# Patient Record
Sex: Female | Born: 1935 | Race: White | Hispanic: No | Smoking: Former smoker
Health system: Southern US, Community
[De-identification: ages and names within clinical notes are randomized; demographics above are authoritative.]

## PROBLEM LIST (undated history)

## (undated) DIAGNOSIS — I10 Essential (primary) hypertension: Secondary | ICD-10-CM

## (undated) DIAGNOSIS — F0390 Unspecified dementia without behavioral disturbance: Secondary | ICD-10-CM

## (undated) DIAGNOSIS — K589 Irritable bowel syndrome without diarrhea: Secondary | ICD-10-CM

## (undated) DIAGNOSIS — G629 Polyneuropathy, unspecified: Secondary | ICD-10-CM

## (undated) DIAGNOSIS — K317 Polyp of stomach and duodenum: Secondary | ICD-10-CM

## (undated) DIAGNOSIS — K5730 Diverticulosis of large intestine without perforation or abscess without bleeding: Secondary | ICD-10-CM

## (undated) HISTORY — DX: Irritable bowel syndrome, unspecified: K58.9

## (undated) HISTORY — DX: Polyp of stomach and duodenum: K31.7

## (undated) HISTORY — PX: CHOLECYSTECTOMY: SHX55

## (undated) HISTORY — DX: Polyneuropathy, unspecified: G62.9

## (undated) HISTORY — PX: ABDOMINAL HYSTERECTOMY: SHX81

## (undated) HISTORY — PX: APPENDECTOMY: SHX54

## (undated) HISTORY — DX: Unspecified dementia, unspecified severity, without behavioral disturbance, psychotic disturbance, mood disturbance, and anxiety: F03.90

## (undated) HISTORY — DX: Essential (primary) hypertension: I10

## (undated) HISTORY — PX: HEMORRHOID SURGERY: SHX153

## (undated) HISTORY — PX: ROTATOR CUFF REPAIR: SHX139

## (undated) HISTORY — PX: ANKLE SURGERY: SHX546

## (undated) HISTORY — DX: Diverticulosis of large intestine without perforation or abscess without bleeding: K57.30

---

## 2005-04-18 ENCOUNTER — Ambulatory Visit: Payer: Self-pay | Admitting: Cardiology

## 2005-06-06 ENCOUNTER — Encounter: Admission: RE | Admit: 2005-06-06 | Discharge: 2005-07-03 | Payer: Self-pay | Admitting: Orthopedic Surgery

## 2005-11-12 ENCOUNTER — Ambulatory Visit: Payer: Self-pay | Admitting: Cardiology

## 2005-11-13 ENCOUNTER — Ambulatory Visit: Payer: Self-pay | Admitting: Cardiology

## 2005-11-28 ENCOUNTER — Ambulatory Visit: Payer: Self-pay | Admitting: Cardiology

## 2006-02-27 ENCOUNTER — Ambulatory Visit: Payer: Self-pay | Admitting: Gastroenterology

## 2006-05-09 ENCOUNTER — Encounter (INDEPENDENT_AMBULATORY_CARE_PROVIDER_SITE_OTHER): Payer: Self-pay | Admitting: *Deleted

## 2006-05-09 ENCOUNTER — Ambulatory Visit: Payer: Self-pay | Admitting: Gastroenterology

## 2006-05-09 ENCOUNTER — Ambulatory Visit (HOSPITAL_COMMUNITY): Admission: RE | Admit: 2006-05-09 | Discharge: 2006-05-09 | Payer: Self-pay | Admitting: Gastroenterology

## 2006-06-05 ENCOUNTER — Ambulatory Visit: Payer: Self-pay | Admitting: Gastroenterology

## 2006-06-30 ENCOUNTER — Ambulatory Visit: Payer: Self-pay | Admitting: Cardiology

## 2006-07-21 ENCOUNTER — Ambulatory Visit: Payer: Self-pay | Admitting: Gastroenterology

## 2007-09-29 ENCOUNTER — Ambulatory Visit: Payer: Self-pay | Admitting: Cardiology

## 2008-01-21 ENCOUNTER — Encounter (HOSPITAL_COMMUNITY): Admission: RE | Admit: 2008-01-21 | Discharge: 2008-02-20 | Payer: Self-pay | Admitting: Otolaryngology

## 2008-02-29 ENCOUNTER — Encounter (HOSPITAL_COMMUNITY): Admission: RE | Admit: 2008-02-29 | Discharge: 2008-03-30 | Payer: Self-pay | Admitting: Otolaryngology

## 2008-11-28 ENCOUNTER — Ambulatory Visit: Payer: Self-pay | Admitting: Cardiology

## 2008-11-28 ENCOUNTER — Encounter: Payer: Self-pay | Admitting: Cardiology

## 2008-11-28 DIAGNOSIS — I10 Essential (primary) hypertension: Secondary | ICD-10-CM | POA: Insufficient documentation

## 2008-11-28 DIAGNOSIS — R079 Chest pain, unspecified: Secondary | ICD-10-CM

## 2009-06-13 DIAGNOSIS — K5900 Constipation, unspecified: Secondary | ICD-10-CM | POA: Insufficient documentation

## 2009-06-13 DIAGNOSIS — R197 Diarrhea, unspecified: Secondary | ICD-10-CM

## 2009-06-13 DIAGNOSIS — K589 Irritable bowel syndrome without diarrhea: Secondary | ICD-10-CM

## 2009-06-13 DIAGNOSIS — Z8719 Personal history of other diseases of the digestive system: Secondary | ICD-10-CM

## 2009-06-13 DIAGNOSIS — G589 Mononeuropathy, unspecified: Secondary | ICD-10-CM | POA: Insufficient documentation

## 2009-06-14 ENCOUNTER — Ambulatory Visit: Payer: Self-pay | Admitting: Gastroenterology

## 2009-06-14 DIAGNOSIS — R109 Unspecified abdominal pain: Secondary | ICD-10-CM

## 2009-06-14 DIAGNOSIS — D126 Benign neoplasm of colon, unspecified: Secondary | ICD-10-CM

## 2009-06-15 LAB — CONVERTED CEMR LAB
Bilirubin Urine: NEGATIVE
Casts: NONE SEEN /lpf
Hemoglobin, Urine: NEGATIVE
Ketones, ur: NEGATIVE mg/dL
Specific Gravity, Urine: 1.026 (ref 1.005–1.030)
Urine Glucose: NEGATIVE mg/dL
pH: 5.5 (ref 5.0–8.0)

## 2009-07-05 ENCOUNTER — Telehealth (INDEPENDENT_AMBULATORY_CARE_PROVIDER_SITE_OTHER): Payer: Self-pay

## 2009-07-06 ENCOUNTER — Ambulatory Visit: Payer: Self-pay | Admitting: Gastroenterology

## 2009-07-06 ENCOUNTER — Ambulatory Visit (HOSPITAL_COMMUNITY): Admission: RE | Admit: 2009-07-06 | Discharge: 2009-07-06 | Payer: Self-pay | Admitting: Gastroenterology

## 2009-07-07 ENCOUNTER — Encounter (INDEPENDENT_AMBULATORY_CARE_PROVIDER_SITE_OTHER): Payer: Self-pay

## 2009-08-17 ENCOUNTER — Ambulatory Visit: Payer: Self-pay | Admitting: Gastroenterology

## 2009-08-17 DIAGNOSIS — R152 Fecal urgency: Secondary | ICD-10-CM

## 2009-12-27 ENCOUNTER — Ambulatory Visit: Payer: Self-pay | Admitting: Gastroenterology

## 2010-01-17 ENCOUNTER — Ambulatory Visit: Payer: Self-pay | Admitting: Gastroenterology

## 2010-04-20 ENCOUNTER — Encounter (INDEPENDENT_AMBULATORY_CARE_PROVIDER_SITE_OTHER): Payer: Self-pay | Admitting: *Deleted

## 2010-05-15 NOTE — Assessment & Plan Note (Signed)
Summary: IRREGULAR BOWEL HABITS, PERSONAL HISTORY OF POLYPS   Visit Type:  Follow-up Visit Primary Care Provider:  Dimas Aguas, M.D.  Chief Complaint:  Irregularity.  History of Present Illness: Pt seen and evaluated in 2008 for intermittent constipation and diarrhea since childhood. Asked pt to use benefiber, Levsin, and TUMs. TCS with MLX prep showed many simpkle adenoma, rare TICS, and no hemorrhoids. She declined referral for ARM/BET Asked pt to try Kegel exercises. she tried high fiber diets but Sx ? worse. Tried low residue diet and Sx?Marland Kitchen Ot likely has pelvic floor dysfunction.Pt scheduled for follow up but did not showx2.  Still having problem with her bowels. Problem with bowels: every other week gets a spell of it. Husband passed away and doesn't eat regularly. Eats whole wheat bread and fruits. Bms: s/t once daily, every other day, and sometimes gets stubborn and won't move and then gets heaviness inlwr abd and then finally gets a complete emptying (hard to soft). Would like one BM every AM. Doesn't leave home wthout her clothes. Wosrt usu. happens midafternoon and its awful. Not necessarily after eating. Can have rectal urgency leading to accidents. No blood in stool or black tarry stools. Not using stool softener. Tried fiber capsule before. Tries not drink water because of bladder. Drank a beer last night and made her urinate a lot. Has urinary problems and can't control her bladder and pad causes bottom to be irritated.   Current Medications (verified): 1)  Acetaminophen 500 Mg  Caps (Acetaminophen) .... As Needed 2)  Aspirin Ec 325 Mg Tbec (Aspirin) .... As Needed 3)  Multivitamins  Tabs (Multiple Vitamin) .... As Directed 4)  Fish Oil .... Once Daily 5)  Ducosate Sodium 100mg  .... Two Times A Day As Needed 6)  Aricept .... One Tablet Daily  Allergies (verified): 1)  ! Codeine  Past History:  Past Medical History: HYPERTENSION, UNSPECIFIED (ICD-401.9) CHEST PAIN-UNSPECIFIED  (ICD-786.50) Peripheral neuropathy Dementia, ? Alzheimer's IBS: mixed Sigmoid colon diverticulosis Multiple simple adenomas TCS 2008  Past Surgical History: HEMORRHOIDECTOMY AT AGE 11 CHOLECYSTECTOMY APPENDECTOMY RIGHT ROTATOR CUFF TEAR SURGERY HYSTERECTOMY  Family History: Family History of Cancer Renal failure  Social History: Now widowed. Husband passed 2 years ago. Tobacco Use - Former. x 40 yrs Alcohol Use - yes Was a Runner, broadcasting/film/video and ran a dairy farm.  Review of Systems       APR 2008: 150 LBS  Per HPI otherwise all systems negative.  Vital Signs:  Patient profile:   77 year old female Height:      62 inches Weight:      146 pounds BMI:     26.80 Temp:     98.1 degrees F oral Pulse rate:   64 / minute BP sitting:   130 / 80  (left arm) Cuff size:   regular  Vitals Entered By: Cloria Spring LPN (June 14, 4096 11:17 AM)  Physical Exam  General:  Well developed, well nourished, no acute distress. Head:  Normocephalic and atraumatic. Eyes:  PERRLA, no icterus. Mouth:  No deformity or lesions. Neck:  Supple; no masses. Lungs:  Clear throughout to auscultation. Heart:  Regular rate and rhythm; no murmurs. Abdomen:   No masses, hepatosplenomegaly or hernias noted. Normal bowel sounds. Soft, mildly tender in BLQ and mildly distended.without guarding and without rebound.   Extremities:  No edema or deformities noted. Neurologic:  Alert and  oriented x4;  grossly normal neurologically.  Impression & Recommendations:  Problem # 1:  ABDOMINAL PAIN,  LOWER (ICD-789.09) Differential diagnosis includes: IBS-mixed, or cystitis. CHECK ua.  ADD BENEFIBER 2 tsp daily for 7 days then  use two times a day. Add dicylomine 10 mg ever morning. FOLLOW A HIGH FIBER DIET. See handout. OPV in 2 months.  Orders: T-Urinalysis ([id number]) T-Urine Microscopic ([id number]) Est. Patient Level V (96295)  Problem # 2:  FULL INCONTINENCE OF FECES (ICD-787.60)  Most likey due to age  andpelvic floor dysfunction. ADD BENEFIBER 2 tsp daily for 7 days then  use two times a day. Add dicylomine 10 mg ever morning. FOLLOW A HIGH FIBER DIET. See handout. ***KEGEL EXERCISES** Contract PELVIC FLOOR MUSCLES FOR 5 SECS AND RELEASE. REPEAT 10 TIMES. DO THIS THREE TIMES A DAY. BEGINNING MAR 10, ADD QUICK RELEASE EXERCISE. CONTRACT AND RELEASE PELVIC MUSCLES RAPIDLY 10 TIMES.  REPEAT 3 TIMES A DAY. Return visit in 2 months.  Orders: Est. Patient Level V (28413)  Problem # 3:  COLONIC POLYPS, ADENOMATOUS (ICD-211.3) Assessment: Unchanged  COLONOSCOPY WITHIN THE NEXT MONTH, Miralax prep.  CC: PCP  Orders: Est. Patient Level V (24401)  Patient Instructions: 1)  ADD BENEFIBER 2 tsp daily for 7 days then  use two times a day. 2)  Add dicylomine 10 mg ever morning. 3)  FOLLOW A HIGH FIBER DIET. See handout. 4)  COLONOSCOPY WITHIN THE NEXT MONTH. 5)  ***KEGEL EXERCISES** 6)  Contract PELVIC FLOOR MUSCLES FOR 5 SECS AND RELEASE. 7)  REPEAT 10 TIMES. DO THIS THREE TIMES A DAY. 8)  BEGINNING MAR 10, ADD QUICK RELEASE EXERCISE. 9)  CONTRACT AND RELEASE PELVIC MUSCLES RAPIDLY 10 TIMES.  10)  REPEAT 3 TIMES A DAY. 11)  Return visit in 2 months. 12)  The medication list was reviewed and reconciled.  All changed / newly prescribed medications were explained.  A complete medication list was provided to the patient / caregiver. Prescriptions: BENTYL 10 MG CAPS (DICYCLOMINE HCL) 1 by mouth every morning  #30 x 5   Entered and Authorized by:   West Bali MD   Signed by:   West Bali MD on 06/14/2009   Method used:   Print then Give to Patient   RxID:   [id number]

## 2010-05-15 NOTE — Progress Notes (Signed)
Summary: phone note  Phone Note Call from Patient   Caller: Patient Summary of Call: Pt called and said she is scheduled for TCS tomorrow. She is in process of drinking Prep. She was concerned because  she has already started having pressure and BM's and thought she was asked to drink too much. I told her no, that is great, seems like she is geting off to a good start. She said she just wanted to be reassured she did not have too much to drink. Initial call taken by: Cloria Spring LPN,  July 05, 2009 3:41 PM

## 2010-05-15 NOTE — Assessment & Plan Note (Signed)
Summary: DIARRHEA   Visit Type:  Follow-up Visit Primary Care Provider:  Dimas Hurst, M.D.   Chief Complaint:  F/U IBS.  History of Present Illness: Tried lactaid and it was nasty. Tried Found a new brand and it is lactose free. Bms: fair, frequnt but has few that are solid. Urgency: 2-3 times a month. Watches for the BR still. Taking the dicyclomine once a day. Didn't try twice a day. Nl formed stools-1x/week. Heavy fruit eater. If eats 3 "square" meals a day then has a regular BM. DOESN'T ALWAYS EAT MEAT. Has seen improvement with dairy free diet, BUT WOULD LIKE TO DRINK MILK. Only 1 extreme/eplosive attack.   Current Medications (verified): 1)  Acetaminophen 500 Mg  Caps (Acetaminophen) .... As Needed 2)  Aspirin Ec 325 Mg Tbec (Aspirin) .... As Needed 3)  Multivitamins  Tabs (Multiple Vitamin) .... As Directed 4)  Fish Oil .... Once Daily 5)  Aricept .... One Tablet Daily 6)  Bentyl 10 Mg Caps (Dicyclomine Hcl) .Marland Kitchen.. 1 By Mouth Every Morning and At [facility] Every Day 7)  Doxycycline Hyclate 100 Mg Caps (Doxycycline Hyclate) .... Take 1 Tablet By Mouth Once A Day 8)  Oxybutynin Chloride 10 Mg Xr24h-Tab (Oxybutynin Chloride) .... Take 1 Tablet By Mouth Once A Day  Allergies (verified): 1)  ! Codeine  Past History:  Past Medical History: Last updated: 08/17/2009 HYPERTENSION, UNSPECIFIED (ICD-401.9) CHEST PAIN-UNSPECIFIED (ICD-786.50) Peripheral neuropathy Dementia, ? Alzheimer's IBS: mixed Sigmoid colon diverticulosis Multiple simple adenomas TCS 2008 2010: TCS-simple adenomas and hyperplastic polyps  Past Surgical History: Last updated: 06/14/2009 HEMORRHOIDECTOMY AT AGE 79 CHOLECYSTECTOMY APPENDECTOMY RIGHT ROTATOR CUFF TEAR SURGERY HYSTERECTOMY  Vital Signs:  Patient profile:   77 year old female Height:      62 inches Weight:      145 pounds BMI:     26.62 Temp:     98.4 degrees F oral Pulse rate:   72 / minute BP sitting:   144 / 70  (left arm) Cuff size:    regular  Vitals Entered By: Cloria Spring LPN (December 27, 2009 9:31 AM)  Physical Exam  General:  Well developed, well nourished, no acute distress. Head:  Normocephalic and atraumatic. Eyes:  PERRL, no icterus. Lungs:  Clear throughout to auscultation. Heart:  Regular rate and rhythm; no murmurs. Abdomen:  Soft, nontender and nondistended. Normal bowel sounds.  Impression & Recommendations:  Problem # 1:  FECAL URGENCY (ICD-787.63) Likely 2o to loose stools. Loose stool multifactorial-bile salt, fruit consumption, and IBS, DOUBT THYROID DISTURBANCE. Take Calcium 500 mg with meals three times a day. Take Lactase pills if drinking 2% milk. Increase Bentyl to 1 tab 30 minutes prior to breakfast and lunch. Follow up in 4 mos. IF CONSTIPATED, REDUCE BENTYL TO ONCE A DAYand/or REDUCE CALCIUM TO two times a day.   CC: PCP  Orders: T-TSH ([id number])  Patient Instructions: 1)  Take Calcium 500 mg with meals three times a day. 2)  Take Lactase pills if drinking 2% milk. 3)  Increase Bentyl to 1 tab 30 minutes prior to breakfast and lunch. 4)  Follow up in 4 mos. 5)  IF YOU'RE CONSTIPATED, REDUCE BENTYL TO ONCE A DAY. 6)  IF YOU'RE CONSTIPATED, REDUCE CALCIUM TO two times a day. 7)  The medication list was reviewed and reconciled.  All changed / newly prescribed medications were explained.  A complete medication list was provided to the patient / caregiver. Prescriptions: BENTYL 10 MG CAPS (DICYCLOMINE HCL) 1 by mouth every morning  AND AT LUNCH EVERY DAY  #60 x 5   Entered and Authorized by:   West Bali MD   Signed by:   West Bali MD on 12/27/2009   Method used:   Electronically to        [facility] (retail)       [address]       [place], [place]  [address]       Ph: [phone]       Fax: [phone]   RxID:   [id number]   Appended Document: DIARRHEA 4 MONTH F/U OPV IS IN THE COMPUTER  Appended Document:  Orders Update    Clinical Lists Changes  Orders: Added new Service order of Est. Patient Level V (84132) - Signed

## 2010-05-15 NOTE — Assessment & Plan Note (Signed)
Summary: FECAL URGENCY   Visit Type:  Follow-up Visit Primary Care Provider:  Dimas Aguas, M.D.  Chief Complaint:  irregular bowels and cant control bowels.  History of Present Illness: Can't control bowels for all her life. BMs: vary-soft, loose, watery, formed. No pain. Drinks milk every AM w/ cereal. Occasional cheese and ice cream. Trouble with bowels: once every 2 weeks. Losing money because can't go on trip without a BR. Fish oil not causing upset stomach. Problems with bowels usu happens in the afternoon.   Current Medications (verified): 1)  Acetaminophen 500 Mg  Caps (Acetaminophen) .... As Needed 2)  Aspirin Ec 325 Mg Tbec (Aspirin) .... As Needed 3)  Multivitamins  Tabs (Multiple Vitamin) .... As Directed 4)  Fish Oil .... Once Daily 5)  Ducosate Sodium 100mg  .... Two Times A Day As Needed 6)  Aricept .... One Tablet Daily 7)  Bentyl 10 Mg Caps (Dicyclomine Hcl) .Marland Kitchen.. 1 By Mouth Every Morning  Allergies (verified): 1)  ! Codeine  Past History:  Past Surgical History: Last updated: 06/14/2009 HEMORRHOIDECTOMY AT AGE 11 CHOLECYSTECTOMY APPENDECTOMY RIGHT ROTATOR CUFF TEAR SURGERY HYSTERECTOMY  Past Medical History: HYPERTENSION, UNSPECIFIED (ICD-401.9) CHEST PAIN-UNSPECIFIED (ICD-786.50) Peripheral neuropathy Dementia, ? Alzheimer's IBS: mixed Sigmoid colon diverticulosis Multiple simple adenomas TCS 2008 2010: TCS-simple adenomas and hyperplastic polyps  Vital Signs:  Patient profile:   77 year old female Height:      62 inches Weight:      144 pounds BMI:     26.43 Temp:     98.1 degrees F oral Pulse rate:   68 / minute BP sitting:   132 / 80  (left arm) Cuff size:   large  Vitals Entered By: Hendricks Limes LPN (Aug 17, 1608 10:42 AM)  Physical Exam  General:  Well developed, well nourished, no acute distress. Head:  Normocephalic and atraumatic. Eyes:  PERRLA, no icterus. Lungs:  Clear throughout to auscultation. Heart:  Regular rate and rhythm; no  murmurs. Abdomen:  Soft, nontender and nondistended. Normal bowel sounds. Extremities:  No edema or deformities noted. Neurologic:  Alert and  oriented x4;  grossly normal neurologically.  Impression & Recommendations:  Problem # 1:  FECAL URGENCY (ICD-787.63) Pt unable to tolerate loose stool likely secondary to pelvic floor weakness and sensation associated with aging, and prior hemorrhoidectomy/hysterectomy. She may have lactose intolerance and bile-salt diarrhea. Pt instructed to bulk up stool, stop Colace, and increase Bentyl. INCREASE BENTYL-30 MINS BEFORE BREAKFAST AND LUNCH. TAKE THE BENTYL(DICYCLOMINE) regardless two times a dayAVOID DAIRY.ADD DIGESTIVE ADVANTAGE LACTOSE INTOLERANCE DAILY. She will call me in 2 weeks with a progress report. RETURN IN 3 MONTHS.  CC: PCP  Other Orders: Est. Patient Level IV (96045)  Patient Instructions: 1)  DO NOT TAKE DOCUSATE SODIUM. 2)  AVOID DAIRY. 3)  ADD DIGESTIVE ADVANTAGE LACTOSE INTOLERANCE DAILY. 4)  INCREASE BENTYL-30 MINS BEFORE BREAKFAST AND LUNCH. 5)  TAKE THE BENTYL(DICYCLOMINE) regardless two times a day 6)  Call me in 2 weeks with a progress report. 7)  RETURN IN 3 MONTHS. 8)  The medication list was reviewed and reconciled.  All changed / newly prescribed medications were explained.  A complete medication list was provided to the patient / caregiver. Prescriptions: BENTYL 10 MG CAPS (DICYCLOMINE HCL) 1 by mouth every morning AND AT LUNCH EVERY DAY  #60 x 5   Entered and Authorized by:   West Bali MD   Signed by:   West Bali MD on 08/17/2009  Method used:   Electronically to        [facility] (retail)       [address]       [place], [place]  [address]       Ph: [phone]       Fax: [phone]   RxID:   [id number]   Appended Document: FECAL URGENCY reminder in computer

## 2010-05-15 NOTE — Letter (Signed)
Summary: UROLOGY REFERRAL  UROLOGY REFERRAL   Imported By: Ave Filter 06/14/2009 13:17:21  _____________________________________________________________________  External Attachment:    Type:   Image     Comment:   External Document  Appended Document: UROLOGY REFERRAL Pt scheduled for 07/05/09@4 :15p.m.Marland KitchenMarland KitchenMarland KitchenPt aware of appt.

## 2010-05-15 NOTE — Letter (Signed)
Summary: Patient Notice, Colon Biopsy Results  [facility]  [address]   [place], [place] [address]   Phone: [phone]  Fax: [phone]       July 07, 2009   Shelly Hurst [address] [place], [place]  [address] December 28, 1924    Dear Shelly Hurst,  I am pleased to inform you that the biopsies taken during your recent colonoscopy did not show any evidence of cancer upon pathologic examination.  Additional information/recommendations:  __No further action is needed at this time.  Please follow-up with your primary care physician for your other healthcare needs.  __Please call [phone] to schedule a return visit to review your condition.  __Continue with the treatment plan as outlined on the day of your exam.  __You should have a repeat colonoscopy examination  in 5-10 years if benefits out weigh the risks. Please stay on a high fiber diet.  Please call us if you are having persistent problems or have questions about your condition that have not been fully answered at this time.  Sincerely,    Cloria Spring LPN  [facility] Ph: [phone]    Fax: [phone]

## 2010-05-15 NOTE — Letter (Signed)
Summary: TCS ORDER  TCS ORDER   Imported By: Ave Filter 06/14/2009 12:09:11  _____________________________________________________________________  External Attachment:    Type:   Image     Comment:   External Document

## 2010-05-17 NOTE — Letter (Signed)
Summary: Recall Office Visit  [facility]  [address]   [place], [place] [address]   Phone: [phone]  Fax: [phone]      April 20, 2010   Shelly Hurst [address] [place], [place]  [address] [birth year]   Dear Ms. [place]otin,   According to our records, it is time for you to schedule a follow-up office visit with [place].   At your convenience, please call [phone] to schedule an office visit. If you have any questions, concerns, or feel that this letter is in error, we would appreciate your call.   Sincerely,    Diana Eves  [facility] Ph: [phone]   Fax: [phone]

## 2010-08-28 NOTE — Assessment & Plan Note (Signed)
[facility]                          [facility] OFFICE NOTE   NAME:Fonseca, NAVDEEP FESSENDEN                       MRN:          [id number]  DATE:09/29/2007                            DOB:          10-10-1924    HISTORY OF PRESENT ILLNESS:  The patient is an 77 year old female with  no prior history of coronary artery disease, but hypertension.  Unfortunately, the patient's husband died several months ago and she is  still very tearful about this.  She denies any orthopnea, PND,  palpitation, or syncope.  However, from cardiovascular perspective she  appears to be doing well.   Currently, the patient takes no medications.   PHYSICAL EXAMINATION:  VITAL SIGNS:  Blood pressure 151/69, heart rate  66, and weight is 144 pounds.  NECK:  Normal carotid upstroke.  No carotid bruits.  LUNGS:  Clear breath sounds bilaterally.  HEART:  Regular rate and rhythm.  Normal S1 and S2.  No murmurs, rubs,  or gallops.  ABDOMEN:  Soft and nontender with good bowel sounds.  EXTREMITIES:  No cyanosis, clubbing, or edema.   A 12-lead electrocardiogram, normal sinus rhythm.  No acute ischemic  changes.   PROBLEMS:  1. History of atypical chest pain with low-risk adenosine Cardiolite      study in 2007.  2. Normal left ventricular function.  3. Hypertension.  4. Peripheral neuropathy.   PLAN:  1. The patient is doing well from a cardiac perspective.  She does      have however hypertension.  We prescribed her 12.5 mg of      hydrochlorothiazide.  2. EKG was reviewed today in the office and was within normal limits.      No further ischemic workup required at the present time.     Learta Codding, MD,FACC  Electronically Signed    GED/MedQ  DD: 09/29/2007  DT: 09/30/2007  Job #: [id number]

## 2010-08-31 NOTE — Assessment & Plan Note (Signed)
[facility]                          [facility] OFFICE NOTE   NAME:Shelly Hurst, Shelly Hurst                       MRN:          [id number]  DATE:06/30/2006                            DOB:          Jul 30, 1924    REASON FOR VISIT:  Scheduled followup. Please refer to Dr. Margarita Mail  clinic note of August 2007 for full details.   HISTORY OF PRESENT ILLNESS:  Since last seen, Shelly Hurst has not had any  recurrent angina pectoris. She was initially referred to [place] for chest  pain, which we felt to be atypical, but in the setting of multiple  cardiac risk factors. She had a low risk adenosine low level stress  Cardiolite in August 2007, reviewed by Dr. Andee Lineman, prior to her last  clinic followup.   On note, the patient has since taken herself off aspirin, as well as a  proton pump inhibitor, and has never been on any anti-hypertensive  medications. She states that she has never been formally diagnosed with  hypertension and is concerned that this may be related to her recent  over-the-counter remedy that she is taking for her peripheral  neuropathy.   CURRENT MEDICATIONS:  None.   PHYSICAL EXAMINATION:  VITAL SIGNS:  Blood pressure 160/74, pulse 66,  regular weight 149.  GENERAL:  A 77 year old female sitting upright. No distress.  HEENT:  Normocephalic and atraumatic.  NECK:  Palpable bilateral carotid pulses without bruits. No JVD.  LUNGS:  Clear to auscultation all fields.  HEART:  Regular rate and rhythm (S1 and S2). No murmur, rub, or gallop.  ABDOMEN:  Soft, nontender, and nondistended  EXTREMITIES:  No significant pedal edema.  NEUROLOGIC:  Flat affect but no focal deficits.   IMPRESSION:  1. Atypical chest pain.      a.     Low risk Adenosine stress Cardiolite August 2007.  2. Normal left ventricular function.  3. Hypertension.      a.     New onset.  4. Aortic sclerosis.  5. Peripheral neuropathy.   PLAN:  1. The patient strongly advised to  resume aspirin at the low dose of      81 mg daily, for primary prevention.  2. The patient strongly advised to start monitoring her blood pressure      on an ambulatory basis and to review this with Dr. Selinda Flavin, at      the time of her next visit. I will defer to Dr.      Dimas Aguas regarding whether or not the patient will need an      antihypertensive medication.  3. Schedule return clinic followup with myself and Dr. Andee Lineman in 1      year.      Gene Serpe, PA-C  Electronically Signed      Learta Codding, MD,FACC  Electronically Signed   GS/MedQ  DD: 06/30/2006  DT: 06/30/2006  Job #: [id number]   cc:   Selinda Flavin

## 2010-08-31 NOTE — Consult Note (Signed)
NAMEIDELLA, LAMONTAGNE                ACCOUNT NO.:  [id number]   MEDICAL RECORD NO.:  [id number]           PATIENT TYPE:   LOCATION:                                 FACILITY:   PHYSICIAN:  Kassie Mends, M.D.      DATE OF BIRTH:  [birth year]   DATE OF CONSULTATION:  02/27/2006  DATE OF DISCHARGE:                                   CONSULTATION   REASON FOR CONSULTATION:  Personal history of polyps and intermittent  constipation and diarrhea.   HISTORY OF PRESENT ILLNESS:  Ms. Melberg is a 77 year old female with a  lifetime history of irregular bowel habits.  She has a personal history of  polyps and her last colonoscopy was 5-6 years ago.  She states she has had  intermittent constipation and diarrhea since childhood.  She denies any  rectal bleeding currently but can have bleeding when she is constipated.  She sometimes has bowel movements that are absurd in size.  Sometimes, she  has to use Vaseline to get her bowel movements started.  At other times, she  has diarrhea.  If she uses fiber supplements, her bowel movements are too  much.  She does not have a bowel movement everyday.  She has 2-3 bowel  movements a week.  She may use stool softeners 1-2 times a week.  She is  managed using stool softeners all her life.  She has a problem with the  sudden onset of abdominal cramps followed by rectal urgency and a large  volume of watery stool.  She has had accidents.  She has 1-2 episodes per  year.  She has no particular food triggers.  She has noticed that this may  be more likely to be triggered by larger meals and eating out.   PAST MEDICAL HISTORY:  1. Irritable bowel syndrome.  2. Neuropathy of unclear etiology.  3. Gallstones.   PAST SURGICAL HISTORY:  1. Hemorrhoidectomy at age 89.  2. Cholecystectomy.  3. Appendectomy.  4. Right rotator cuff tear surgery.  5. Hysterectomy.   ALLERGIES:  CODEINE.   MEDICATIONS:  1. Tylenol as needed.  2. Ibuprofen as needed.  3.  Multi-vitamin.  4. Fish oil daily.   FAMILY HISTORY:  She denies any family history of colon cancer or colon  polyps.   SOCIAL HISTORY:  She has been married for 56 years.  Her daughter is a  Doctor, general practice in [place].  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  She  moved to [place] to be closer to her daughter so that she could  assist her with caring for her ailing husband.  She stopped smoking 17 years  ago.  She rarely drinks beer or wine.  [place]   REVIEW OF SYSTEMS:  As per the HPI, otherwise, all systems are negative.   PHYSICAL EXAMINATION:  VITAL SIGNS:  Weight 151.5 pounds, height 5 foot 2 inches, BMI 27.6  (slightly overweight), temperature 97.4, blood pressure 142/80, pulse 69.  GENERAL:  She is in no apparent distress, alert and oriented x4.  HEENT:  Normocephalic and atraumatic, pupils equal and reactive to light,  mouth with no oral lesions.  Posterior pharynx without erythema or exudate.  NECK:  Full range of motion and no lymphadenopathy.  LUNGS:  Clear to auscultation bilaterally.  CARDIOVASCULAR EXAM: Shows a regular rhythm, no murmur, normal S1 and S2.  ABDOMEN:  Bowel bowel sounds are present, soft, nontender, nondistended, no  rebound or guarding, no hepatosplenomegaly.  No abdominal bruits.  Her  midline incision is well healed.  Obese.  EXTREMITIES:  Without cyanosis, clubbing, and edema.  NEUROLOGICAL:  She has no focal neurological deficits.   LABORATORY DATA:  BUN 17, creatinine 0.7, potassium 4.3, albumin 4.2, AST  18, ALT 15, alkaline phos 69, total bilirubin 0.4, hemoglobin 14.4,  platelets 333.   ASSESSMENT:  Ms. Mcfall is a 77 year old female with intermittent  constipation and diarrhea.  Her postprandial abdominal cramps followed by  watery stool may be secondary to post cholecystectomy diarrhea.  She has a  personal history of polyps and her last colonoscopy was over five years ago.  Thank you for  allowing me to see Ms. Huneycutt in consultation.  My  recommendations follow.   RECOMMENDATIONS:  1. She will be scheduled for a colonoscopy in January 2008 with a Gatorade      bowel prep.  2. I recommend 1 tsp of Benefiber or Fibersure daily.  3. She may use 1-2 Tums with meals when eating out or with a larger meal      to avoid loose stools after eating.  She is cautioned that this may      cause constipation.  She may use 1-2 Levsin 0.125 mg tablets under her      tongue with the onset of abdominal pain to avoid loose stool.  She is      cautioned that this may cause dry mouth, dry eyes, blurred vision,      drowsiness, and urinary retention.  She is given this information in      written format.  4. She may follow up in three months.      Kassie Mends, M.D.  Electronically Signed     SM/MEDQ  D:  02/27/2006  T:  02/27/2006  Job:  [id number]   cc:   Selinda Flavin  Fax: [phone]

## 2010-08-31 NOTE — Assessment & Plan Note (Signed)
[facility]                            [facility] OFF[facility] NOTE   NAME:Shelly Hurst, Shelly Hurst                     MRN:          [id number]  DATE:11/12/2005                            DOB:          Jul 31, 1924    REASON FOR CONSULTATION:  Shelly Hurst is a very pleasant 77 year old female  with no prior history of heart disease, now referred for evaluation of  recent episode of angina pectoris.   The patient's  cardiac risk factors are notable for mixed dyslipidemia,  history of tobacco smoking, and age.  She has no known history of  hypertension, diabetes or family history of premature coronary artery  disease.   The patient presents with complaint of a recent episode of angina pectoris  which occurred November 04, 2005.  She characterized the discomfort as an  elephant sitting on my chest.  This occurred shortly after she lay down in  a recliner to take a nap, some 30 minutes after having had lunch.  The chest  pressure was associated with dyspnea and diaphoresis, but no nausea/vomiting  or radiation to the jaw or upper extremities.  The episode lasted  approximately 30 minutes and resolved spontaneously.  The patient did take  an aspirin. She has not had any recurrent symptoms since that episode. She  has, however, continued to perform her usual activities of daily living.   Notably, she also describes a similar episode, albeit less intense, which  occurred some two months ago.  She described that sensation as a slight  heaviness, again over the anterior chest and again associated shortly after  having had a meal.   The patient otherwise reports being quite active and, in fact, is able to  climb a full flight of stairs with no associated chest discomfort or  dyspnea.  She also denies any recent development of orthopnea, PND or lower  extremity edema.   Electrocardiogram in our office today reveals a couple of __________ at 64,  BMP with  left axis  deviation and nonspecific ST abnormalities; there are T  waves in leads I and aVL.   ALLERGIES:  CODEINE.  THE PATIENT DENIES ANY ALLERGY TO CONTRAST MATERIAL OR  SHELLFISH.   CURRENT MEDICATIONS:  Coated aspirin of 325 b.i.d., Protonix 20 daily  (however, patient has not taken either of these medications since last  Friday).   PAST MEDICAL HISTORY:  1.  Normal left ventricular function.  1a.  LVH/EF 60-65% with no definite WMAs; aortic sclerosis/no stenosis by  echocardiogram January 2007.  1.  Mixed dyslipidemia.  2a.  Recent lipid profile: Total cholesterol 176, triglycerides 232, HDL 33  and LDL 97.  1.  Status post cholecystectomy, 1964.  2.  Status post right shoulder surgery, 1987.  3.  Peripheral neuropathy, 05/08, nondiabetic.  4.  Status post hysterectomy, 1979.  5.  History of heart murmur.  6.  Status post left ankle surgery, December 2006.   SOCIAL HISTORY:  The patient is married and has two children.  She has not  smoked tobacco in 17 years but reports prior 40 pack year history  of  smoking.  Drinks alcohol on occasion.   FAMILY HISTORY:  Mother deceased at age 76 secondary to renal failure,  father deceased age 47 from complications of bladder cancer.  There is no  known family history of CAD.   REVIEW OF SYSTEMS:  In addition to as noted in the HPI, the patient has  occasional reflux symptoms which are not new.  She denies any evidence of  overt bleeding.  She complains of intermittent headaches and, in fact, had a  CT scan of the head earlier today (results are pending).  She denies any  prior history of myocardial infarction, congestive heart failure, syncope or  stroke.  Remaining systems negative.   PHYSICAL EXAMINATION:  VITAL SIGNS:  Blood pressure 130/70, pulse 64  regular, weight 148.  GENERAL: This is a 77 year old female, sitting upright, in no apparent  distress.  HEENT:  Normocephalic, atraumatic.  NECK:  Palpable bilateral carotid pulses  without bruits; no JVD.  LUNGS:  Clear to auscultation all fields.  HEART:  Regular rate and rhythm (S1 and S2).  No significant murmurs.  ABDOMEN:  Soft, nontender with intact  bowel sounds, no bruits.  EXTREMITIES:  Palpable bilateral femoral pulses with faint, soft left  femoral bruit, brisk peripheral pulses without edema.  NEUROLOGIC:  No focal  deficits.   IMPRESSION:  Shelly Hurst is a very pleasant 77 year old female with no prior  cardiac history, now referred for evaluation of recent episode of  postprandial angina pectoris associated with dyspnea and diaphoresis.  She  has not had any recurrent symptoms and denies any frank exertional chest  discharge since this episode.  The episode, however, is worrisome due to her  characterization of it as experiencing a sensation of an elephant sitting  on my chest.   The baseline electrocardiogram today shows no acute changes but is notable  for  changes of T waves in the high lateral leads.   The patient's cardiac risk factors are notable  for dyslipidemia,  history  of tobacco, and age.   Of note, an echocardiogram in January of this year revealed normal left  ventricular function with no definite  wall motion abnormalities; aortic  sclerosis without evidence of stenosis.   PLAN:  Following review with Dr. Simona Huh, recommendation is to proceed  with a stress Cardiolite which has been scheduled for tomorrow, per Dr.  Orvan Falconer.  The patient knows, however, that if perfusion imaging reveals any  suggestion of ischemia, then we will have the patient return to the office  to discuss proceeding with a diagnostic cardiac catheterization.  The  patient is quite agreeable with this plan and is willing to proceed with the  catheterization, if necessary.  We also discussed the risks/benefits of a  catheterization during today's visit.  Regarding the stress test, however, I also recommended my preference to change this to a physiologic  stress test  if at all possible.  She is willing to proceed with this as well.  Regarding  medications, I have asked her to go back on aspirin and that she just needs  to be on a baby dose for now. Further recommendations  regarding additional  medications will be made pending review of her stress test results.                                   Gene Serpe, PA-C  Jonelle Sidle, MD   GS/MedQ  DD:  11/12/2005  DT:  11/12/2005  Job #:  [id number]   cc:   Marcelino Duster

## 2010-08-31 NOTE — Assessment & Plan Note (Signed)
[facility]                            [facility] NOTE   NAME:Shelly Hurst, Shelly Hurst                     MRN:          [id number]  DATE:11/28/2005                            DOB:          [birth year]    HISTORY OF PRESENT ILLNESS:  The patient is a 77 year old female who was  recently seen in our office on November 12, 2005 after she experienced an  episode of substernal chest pain. The patient was referred for a Cardiolite  stress study which showed a small lateral defect with some reversibility. It  was felt that this was a low risk study. The patient presents for followup  and to discuss the results. She states that she has been doing extremely  well. She has no substernal chest pain, no shortness of breath. She did have  an ankle injury and somewhat limited in her activity. We discussed  extensively the results of study today.   MEDICATIONS:  1. Aspirin 81 mg a day.  2. Protonix 20 mg a day.   PHYSICAL EXAMINATION:  VITAL SIGNS:  Blood pressure 150/70, heart rate 60  beats per minute.  GENERAL:  A well-nourished white female in no apparent distress.  HEENT:  Pupils isocoric, conjunctivae clear.  Marland Kitchen  NECK:  Supple, no carotid bruits.  LUNGS:  Clear.  HEART:  Regular rate and rhythm with 2/6 crescendo/decrescendo murmur at the  right upper sternal border.  ABDOMEN:  Soft.  EXTREMITIES:  No edema.   PROBLEM LIST:  1. Substernal chest pain.      a.     No recurrent chest pain.      b.     Abnormal Cardiolite stress study, low risk.  2. Rule out hypertension.  3. Rule out peripheral neuropathy.  4. Cardiac murmur.   PLAN:  1. The patient's symptoms were somewhat atypical several weeks ago, they      have not recurred. Although we cannot rule out underlying coronary      artery disease based on the Cardiolite study, it appears to be low      risk.  2. Discussed with the patient various options either to treat her      conservatively  with risk factor modification versus __________ for      catheterization. She has opted for the former.  3. I have asked the patient to keep a close eye on her blood pressure as      she might need treatment for this.  4. The patient's heart murmur is benign and likely represents a flow      murmur.  5. The patient will be given a prescription for nitroglycerin p.r.n. in      the event she has future chest pain, and she will also notify our      office.                                   Learta Codding, MD, [facility]   GED/MedQ  DD:  11/28/2005  DT:  11/28/2005  Job #:  [id number]   cc:   Marcelino Duster

## 2010-08-31 NOTE — Op Note (Signed)
NAMEJOHNNA, Shelly Hurst                ACCOUNT NO.:  [id number]   MEDICAL RECORD NO.:  [id number]          PATIENT TYPE:  AMB   LOCATION:  DAY                           FACILITY:  [facility]   PHYSICIAN:  Kassie Mends, M.D.      DATE OF BIRTH:  05/10/1924   DATE OF PROCEDURE:  05/09/2006  DATE OF DISCHARGE:                               OPERATIVE REPORT   PROCEDURE:  Colonoscopy with cold forceps polypectomy, cold snare  polypectomy, and snare cautery polypectomy.   INDICATION FOR EXAM:  Shelly Hurst is a 78 year old female with a personal  history of polyps.  She presents for colon cancer screening.   FINDINGS:  1. Multiple 2 mm to 6 mm colon polyps seen throughout the colon.  She      had a single cecal polyp.  The polyps were seen in the ascending      colon, transverse colon, and a single rectal polyp was seen.  The      polyps were removed using various techniques, including cold      forceps biopsies, cold snare and snare cautery.  She had rare      sigmoid colon diverticulosis.  Otherwise no masses, inflammatory      changes or AVMs seen.  2. Normal retroflexed view of the rectum.   RECOMMENDATIONS:  1. High fiber diet.  She was given a handout on diverticulosis, polyps      and high fiber diet.  2. Screening colonoscopy in 3 years.  She has no biological children.  3. No aspirin or anti-inflammatory drugs or anticoagulation for 7      days.  I will call her with the results of her pathology report.  4. She has a follow-up appointment to see me on February12.   MEDICATIONS:  1. Demerol 50 mg IV.  2. Versed 6 mg IV.   PROCEDURE TECHNIQUE:  Physical exam was performed and informed consent  was obtained from the patient, after explaining the benefits, risks and  alternatives to the procedure.  The patient was connected to the monitor  and placed in the left lateral position.  Continuous oxygen was provided  by nasal cannula and IV medicine administered through an indwelling  cannula.  After administration of sedation and rectal exam, the  patient's rectum was intubated  and the scope was advanced under direct visualization to the cecum.  The  scope was subsequently removed slowly by carefully examining the color,  texture, anatomy and integrity of the mucosa on the way out.  The  patient was recovered in the endoscopy suite and discharged home in  satisfactory condition.      Kassie Mends, M.D.  Electronically Signed     SM/MEDQ  D:  05/09/2006  T:  05/09/2006  Job:  [id number]   cc:   Selinda Flavin  Fax: [phone]

## 2011-02-20 ENCOUNTER — Other Ambulatory Visit (HOSPITAL_COMMUNITY): Payer: Self-pay | Admitting: Radiology

## 2011-02-27 ENCOUNTER — Other Ambulatory Visit (HOSPITAL_COMMUNITY): Payer: Self-pay | Admitting: Radiology

## 2011-03-11 ENCOUNTER — Other Ambulatory Visit (HOSPITAL_COMMUNITY): Payer: Self-pay | Admitting: Diagnostic Neuroimaging

## 2011-03-11 DIAGNOSIS — R011 Cardiac murmur, unspecified: Secondary | ICD-10-CM

## 2011-03-12 ENCOUNTER — Ambulatory Visit (HOSPITAL_COMMUNITY): Payer: Medicare Other | Attending: Cardiology | Admitting: Radiology

## 2011-03-12 DIAGNOSIS — R011 Cardiac murmur, unspecified: Secondary | ICD-10-CM | POA: Insufficient documentation

## 2011-03-12 DIAGNOSIS — I319 Disease of pericardium, unspecified: Secondary | ICD-10-CM | POA: Insufficient documentation

## 2011-03-12 DIAGNOSIS — I10 Essential (primary) hypertension: Secondary | ICD-10-CM | POA: Insufficient documentation

## 2011-03-12 DIAGNOSIS — R42 Dizziness and giddiness: Secondary | ICD-10-CM | POA: Insufficient documentation

## 2011-03-13 ENCOUNTER — Encounter (HOSPITAL_COMMUNITY): Payer: Self-pay | Admitting: Diagnostic Neuroimaging

## 2011-03-15 ENCOUNTER — Other Ambulatory Visit: Payer: Self-pay | Admitting: Diagnostic Neuroimaging

## 2011-03-15 DIAGNOSIS — I6529 Occlusion and stenosis of unspecified carotid artery: Secondary | ICD-10-CM

## 2011-04-11 ENCOUNTER — Ambulatory Visit
Admission: RE | Admit: 2011-04-11 | Discharge: 2011-04-11 | Disposition: A | Discharge status: Home / Self Care | Payer: Medicare Other | Source: Ambulatory Visit | Attending: Diagnostic Neuroimaging | Admitting: Diagnostic Neuroimaging

## 2011-04-11 DIAGNOSIS — I6529 Occlusion and stenosis of unspecified carotid artery: Secondary | ICD-10-CM

## 2011-04-11 MED ORDER — IOHEXOL 350 MG/ML SOLN
100.0000 mL | Freq: Once | INTRAVENOUS | Status: AC | PRN
  Administered 2011-04-11: 100 mL via INTRAVENOUS

## 2011-10-16 ENCOUNTER — Encounter: Payer: Self-pay | Admitting: *Deleted

## 2011-10-16 ENCOUNTER — Encounter: Payer: Self-pay | Admitting: Cardiology

## 2012-08-24 ENCOUNTER — Ambulatory Visit (INDEPENDENT_AMBULATORY_CARE_PROVIDER_SITE_OTHER): Payer: Medicare Other | Admitting: Cardiology

## 2012-08-24 ENCOUNTER — Encounter: Payer: Self-pay | Admitting: Cardiology

## 2012-08-24 VITALS — BP 135/56 | HR 65 | Ht 62.0 in | Wt 142.0 lb

## 2012-08-24 DIAGNOSIS — I10 Essential (primary) hypertension: Secondary | ICD-10-CM

## 2012-08-24 NOTE — Patient Instructions (Addendum)
   No f/u needed. Your physician recommends that you continue on your current medications as directed. Please refer to the Current Medication list given to you today.

## 2012-08-24 NOTE — Progress Notes (Signed)
HPI The patient presents for evaluation of an abnormal echocardiogram. She has not had any prior cardiac history. However, recently she developed some lower extremity swelling. Her Norvasc was stopped area. She was treated with Lasix as below. Her edema has resolved. She did have an echocardiogram which demonstrates hypertrophic cardiomyopathy. There is an outflow gradient with a peak with Valsalva of 100 mmHg. The left atrium is moderately dilated. There is moderate mitral regurgitation. The aortic valve is calcified. Pulmonary pressures are mildly elevated. She was referred for evaluation of this.  The patient denies any cardiovascular symptoms. She denies any chest pressure, neck or arm discomfort. She does not have palpitations, presyncope or syncope. She denies any PND or orthopnea. In particular she denies any dyspnea. She can do activities such as walking her dog. She avoids adding extra salt.   Allergies  Allergen Reactions  . Codeine Other (See Comments)    hallucination    Current Outpatient Prescriptions  Medication Sig Dispense Refill  . aspirin EC 81 MG tablet Take 81 mg by mouth daily.      Marland Kitchen donepezil (ARICEPT) 10 MG tablet Take 10 mg by mouth at bedtime.      . furosemide (LASIX) 20 MG tablet Take 20 mg by mouth 2 (two) times daily.      Marland Kitchen lisinopril (PRINIVIL,ZESTRIL) 10 MG tablet Take 10 mg by mouth daily.      . memantine (NAMENDA) 10 MG tablet Take 10 mg by mouth 2 (two) times daily.      . Multiple Vitamins-Minerals (MULTIVITAMIN PO) Take 1 tablet by mouth daily.       . solifenacin (VESICARE) 5 MG tablet Take 10 mg by mouth daily.       No current facility-administered medications for this visit.    Past Medical History  Diagnosis Date  . Hypertension   . Chest pain   . Peripheral neuropathy   . Dementia   . IBS (irritable bowel syndrome)   . Diverticulosis of sigmoid colon   . Hyperplastic polyps of stomach     Past Surgical History  Procedure  Laterality Date  . Hemorrhoidecomy      at age 67  . Cholecystectomy    . Appendectomy    . Rotator cuff repair      Family History  Problem Relation Age of Onset  . Cancer    . Kidney failure      History   Social History  . Marital Status: Widowed    Spouse Name: N/A    Number of Children: N/A  . Years of Education: N/A   Occupational History  . Not on file.   Social History Main Topics  . Smoking status: Former Smoker -- 0.25 packs/day for 30 years    Start date: 04/15/1946    Quit date: 04/15/1988  . Smokeless tobacco: Never Used  . Alcohol Use: Yes  . Drug Use: Not on file  . Sexually Active: Not on file   Other Topics Concern  . Not on file   Social History Narrative  . No narrative on file    EAV:[id number] for dizziness, occasional difficulty swallowing and, urinary frequency, easy bruising. Negative for all other systems.  PHYSICAL EXAM BP 135/56  Pulse 65  Ht 5\' 2"  (1.575 m)  Wt 142 lb (64.411 kg)  BMI 25.97 kg/m2 GENERAL:  Well appearing HEENT:  Pupils equal round and reactive, fundi not visualized, oral mucosa unremarkable NECK:  No jugular venous distention, waveform within normal  limits, carotid upstroke brisk and symmetric, no bruits, no thyromegaly LYMPHATICS:  No cervical, inguinal adenopathy LUNGS:  Clear to auscultation bilaterally BACK:  No CVA tenderness CHEST:  Unremarkable HEART:  PMI not displaced or sustained,S1 and S2 within normal limits, no S3, no S4, no clicks, no rubs, no murmurs ABD:  Flat, positive bowel sounds normal in frequency in pitch, no bruits, no rebound, no guarding, no midline pulsatile mass, no hepatomegaly, no splenomegaly EXT:  2 plus pulses throughout, no edema, no cyanosis no clubbing SKIN:  No rashes no nodules NEURO:  Cranial nerves II through XII grossly intact, motor grossly intact throughout PSYCH:  Cognitively intact, oriented to person place and time  EKG:  Sinus rhythm, rate 68, left axis deviation,  left anterior fascicular block, no acute ST-T wave changes.  08/24/2012  ASSESSMENT AND PLAN  HCM:  The patient does have evidence of this on an echo. However, she has no symptoms related. Given this no specific therapy is indicated. We did discuss management and symptoms that can occur which can include heart failure symptoms. These would be treated if they occur. For now I don't think any change in therapy such as a negative inotrope is indicated.  EDEMA:  This has responded to change in blood pressure medicine and diuretic. There's not any evidence of pulmonary hypertension, right-sided failure or left side systolic failure. Whether this could of been related to Norvasc or perhaps a change in salt but she's living in a retirement community or some mild venous insufficiency I think is reasonable for her to reduce her current Lasix, watch her salt intake and let Dr. Dimas Aguas know if this worsens.  HTN:  The blood pressure is at target. No change in medications is indicated. We will continue with therapeutic lifestyle changes (TLC).

## 2012-08-27 ENCOUNTER — Encounter: Payer: Self-pay | Admitting: Cardiology

## 2014-04-13 ENCOUNTER — Emergency Department (HOSPITAL_COMMUNITY): Payer: Medicare Other

## 2014-04-13 ENCOUNTER — Encounter (HOSPITAL_COMMUNITY): Payer: Self-pay

## 2014-04-13 ENCOUNTER — Emergency Department (HOSPITAL_COMMUNITY)
Admission: EM | Admit: 2014-04-13 | Discharge: 2014-04-13 | Disposition: A | Discharge status: Home / Self Care | Payer: Medicare Other | Attending: Emergency Medicine | Admitting: Emergency Medicine

## 2014-04-13 DIAGNOSIS — F0390 Unspecified dementia without behavioral disturbance: Secondary | ICD-10-CM | POA: Insufficient documentation

## 2014-04-13 DIAGNOSIS — Y92129 Unspecified place in nursing home as the place of occurrence of the external cause: Secondary | ICD-10-CM | POA: Diagnosis not present

## 2014-04-13 DIAGNOSIS — Z23 Encounter for immunization: Secondary | ICD-10-CM | POA: Insufficient documentation

## 2014-04-13 DIAGNOSIS — Y9389 Activity, other specified: Secondary | ICD-10-CM | POA: Insufficient documentation

## 2014-04-13 DIAGNOSIS — Z87891 Personal history of nicotine dependence: Secondary | ICD-10-CM | POA: Insufficient documentation

## 2014-04-13 DIAGNOSIS — Z8719 Personal history of other diseases of the digestive system: Secondary | ICD-10-CM | POA: Insufficient documentation

## 2014-04-13 DIAGNOSIS — S61412A Laceration without foreign body of left hand, initial encounter: Secondary | ICD-10-CM | POA: Diagnosis not present

## 2014-04-13 DIAGNOSIS — S199XXA Unspecified injury of neck, initial encounter: Secondary | ICD-10-CM | POA: Diagnosis not present

## 2014-04-13 DIAGNOSIS — Z8669 Personal history of other diseases of the nervous system and sense organs: Secondary | ICD-10-CM | POA: Diagnosis not present

## 2014-04-13 DIAGNOSIS — Z79899 Other long term (current) drug therapy: Secondary | ICD-10-CM | POA: Diagnosis not present

## 2014-04-13 DIAGNOSIS — Y998 Other external cause status: Secondary | ICD-10-CM | POA: Diagnosis not present

## 2014-04-13 DIAGNOSIS — Z7982 Long term (current) use of aspirin: Secondary | ICD-10-CM | POA: Diagnosis not present

## 2014-04-13 DIAGNOSIS — W19XXXA Unspecified fall, initial encounter: Secondary | ICD-10-CM

## 2014-04-13 DIAGNOSIS — S0093XA Contusion of unspecified part of head, initial encounter: Secondary | ICD-10-CM | POA: Insufficient documentation

## 2014-04-13 DIAGNOSIS — I10 Essential (primary) hypertension: Secondary | ICD-10-CM | POA: Diagnosis not present

## 2014-04-13 DIAGNOSIS — S0990XA Unspecified injury of head, initial encounter: Secondary | ICD-10-CM | POA: Diagnosis present

## 2014-04-13 DIAGNOSIS — S3992XA Unspecified injury of lower back, initial encounter: Secondary | ICD-10-CM | POA: Diagnosis not present

## 2014-04-13 DIAGNOSIS — W01198A Fall on same level from slipping, tripping and stumbling with subsequent striking against other object, initial encounter: Secondary | ICD-10-CM | POA: Insufficient documentation

## 2014-04-13 MED ORDER — TETANUS-DIPHTH-ACELL PERTUSSIS 5-2.5-18.5 LF-MCG/0.5 IM SUSP
0.5000 mL | Freq: Once | INTRAMUSCULAR | Status: AC
  Administered 2014-04-13: 0.5 mL via INTRAMUSCULAR
  Filled 2014-04-13: qty 0.5

## 2014-04-13 NOTE — ED Notes (Signed)
Patient with "slip and fall" this morning at [facility]. Unwitnessed. Patient denies LOC. Hematoma to left upper forehead/temporal region. C/o headache mostly. Skin tear to left hand on fourth knuckle. Wound cleansed with sterile normal saline. Temporary dressing applied. Patient at baseline mental status. Denies n/v, denies vision changes.

## 2014-04-13 NOTE — ED Provider Notes (Signed)
CSN: [id number]     Arrival date & time 04/13/14  2353 History  This chart was scribed for Shelly Rank, MD by Lowella Petties, ED Scribe. The patient was seen in room APA04/APA04. Patient's care was started at 7:50 AM.    Chief Complaint  Patient presents with  . Fall   The history is provided by the patient. No language interpreter was used.   HPI Comments: Shelly Hurst is a 80 y.o. female who presents to the Emergency Department after a fall earlier today at her nursing home, [facility]. She states that she slipped and hit the left side of her head and hand on the floor. She denies LOC. She reports constant pain in her neck, left head, and left arm. She denies taking anticoagulants. She denies dizziness or weakness.   Past Medical History  Diagnosis Date  . Hypertension   . Peripheral neuropathy   . Dementia   . IBS (irritable bowel syndrome)   . Diverticulosis of sigmoid colon   . Hyperplastic polyps of stomach    Past Surgical History  Procedure Laterality Date  . Hemorrhoid surgery      at age 60  . Cholecystectomy    . Appendectomy    . Rotator cuff repair      right  . Abdominal hysterectomy    . Ankle surgery     Family History  Problem Relation Age of Onset  . Cancer    . Kidney failure     History  Substance Use Topics  . Smoking status: Former Smoker -- 0.25 packs/day for 30 years    Start date: 04/15/1946    Quit date: 04/15/1988  . Smokeless tobacco: Never Used  . Alcohol Use: Yes   OB History    No data available     Review of Systems  Musculoskeletal: Positive for back pain, arthralgias (left arm) and neck pain.  Neurological: Positive for headaches.  A complete 10 system review of systems was obtained and all systems are negative except as noted in the HPI and PMH.   Allergies  Codeine  Home Medications   Prior to Admission medications   Medication Sig Start Date End Date Taking? Authorizing Provider  aspirin EC 81 MG tablet Take 81 mg  by mouth daily.   Yes Historical Provider, MD  donepezil (ARICEPT) 10 MG tablet Take 10 mg by mouth at bedtime.   Yes Historical Provider, MD  furosemide (LASIX) 20 MG tablet Take 20 mg by mouth 2 (two) times daily.   Yes Historical Provider, MD  lisinopril (PRINIVIL,ZESTRIL) 10 MG tablet Take 10 mg by mouth daily.   Yes Historical Provider, MD  memantine (NAMENDA) 10 MG tablet Take 10 mg by mouth 2 (two) times daily.   Yes Historical Provider, MD  Multiple Vitamins-Minerals (MULTIVITAMIN PO) Take 1 tablet by mouth daily.    Yes Historical Provider, MD  solifenacin (VESICARE) 5 MG tablet Take 10 mg by mouth daily.   Yes Historical Provider, MD   Triage Vitals: BP 133/48 mmHg  Pulse 65  Temp(Src) 98.4 F (36.9 C) (Oral)  Resp 17  Ht 5\' 2"  (1.575 m)  Wt 145 lb (65.772 kg)  BMI 26.51 kg/m2  SpO2 95% Physical Exam  Constitutional: She appears well-developed and well-nourished. No distress.  HENT:  Head: Normocephalic.  Right Ear: External ear normal.  Left Ear: External ear normal.  Hematoma left forehead   Eyes: Conjunctivae are normal. Right eye exhibits no discharge. Left eye exhibits  no discharge. No scleral icterus.  Neck: Neck supple. No tracheal deviation present.  Cardiovascular: Normal rate, regular rhythm and intact distal pulses.   Pulmonary/Chest: Effort normal and breath sounds normal. No stridor. No respiratory distress. She has no wheezes. She has no rales.  Abdominal: Soft. Bowel sounds are normal. She exhibits no distension. There is no tenderness. There is no rebound and no guarding.  Musculoskeletal: She exhibits no edema.       Cervical back: She exhibits tenderness. She exhibits no bony tenderness, no swelling and no edema.       Thoracic back: She exhibits bony tenderness. She exhibits no swelling, no edema and no deformity.       Left hand: She exhibits no tenderness, no bony tenderness and normal two-point discrimination. Normal sensation noted. Normal strength  noted.  Laceration dorsal aspect of hand between thumb and first finger, no 1-2 mm deep, approx 1-2 cm length, no tendon or neurovascular injury  Neurological: She is alert. She has normal strength. No cranial nerve deficit (no facial droop, extraocular movements intact, no slurred speech) or sensory deficit. She exhibits normal muscle tone. She displays no seizure activity. Coordination normal.  Skin: Skin is warm and dry. No rash noted.  Psychiatric: She has a normal mood and affect.  Nursing note and vitals reviewed.   ED Course  LACERATION REPAIR Date/Time: 04/13/2014 9:45 AM Performed by: Shelly Hurst Authorized by: Shelly Hurst Consent: Verbal consent obtained. Risks and benefits: risks, benefits and alternatives were discussed Consent given by: patient Body area: upper extremity Location details: left hand Laceration length: 2 cm Tendon involvement: none Nerve involvement: none Vascular damage: no Irrigation solution: wound cleanser. Irrigation method: jet lavage Amount of cleaning: standard Debridement: none Degree of undermining: none Skin closure: glue Approximation: close Approximation difficulty: simple Patient tolerance: Patient tolerated the procedure well with no immediate complications   (including critical care time) DIAGNOSTIC STUDIES: Oxygen Saturation is 95% on room air, adequate by my interpretation.    COORDINATION OF CARE: 7:53 AM-Discussed treatment plan which includes back X-ray, head CT-scan, and laceration repair with pt at bedside and pt agreed to plan.   Labs Review Labs Reviewed - No data to display  Imaging Review Dg Thoracic Spine W/swimmers  04/13/2014   CLINICAL DATA:  Slipped and fell this morning. Back pain. Initial encounter.  EXAM: THORACIC SPINE - 2 VIEW + SWIMMERS  COMPARISON:  Chest x-ray 03/17/2014  FINDINGS: Degenerative changes throughout the thoracic spine with disc space narrowing and spurring. Normal alignment. No fracture.   IMPRESSION: No acute bony abnormality.   Electronically Signed   By: Rolm Baptise M.D.   On: 04/13/2014 08:33   Ct Head Wo Contrast  04/13/2014   CLINICAL DATA:  Patient fell at nursing, hitting left side of head. Pain  EXAM: CT HEAD WITHOUT CONTRAST  CT CERVICAL SPINE WITHOUT CONTRAST  TECHNIQUE: Multidetector CT imaging of the head and cervical spine was performed following the standard protocol without intravenous contrast. Multiplanar CT image reconstructions of the cervical spine were also generated.  COMPARISON:  Head CT March 18, 2014  FINDINGS: CT HEAD FINDINGS  There is mild diffuse atrophy, stable. There is no intracranial mass, hemorrhage, extra-axial fluid collection, or midline shift. There is slight small vessel disease in the centra semiovale bilaterally. Elsewhere gray-white compartments are within normal limits. There is no demonstrable acute infarct.  There is a sizable left frontal scalp hematoma, larger than on prior study. Bony calvarium appears intact. The mastoid  air cells are clear.  CT CERVICAL SPINE FINDINGS  There is no fracture or spondylolisthesis. Prevertebral soft tissues and predental space regions are normal. There is moderate disc space narrowing at C5-6 and C7-T1. There is partial ankylosis at C2-3. There is multilevel facet hypertrophy. No disc extrusion or stenosis. Exit foraminal narrowing is marked at C5-6 on the right due to bony hypertrophy. Note that there is a bone island in the left posterior C6 vertebral body. There is calcification in each carotid artery.  IMPRESSION: CT head: Sizable left frontal scalp hematoma. No fracture. Mild diffuse atrophy with mild periventricular small vessel disease. No intracranial mass, hemorrhage, or extra-axial fluid collection. No acute appearing infarct appreciable.  CT cervical spine: Multilevel arthropathy. No fracture or appreciable spondylolisthesis. Bilateral carotid artery calcification.   Electronically Signed   By: Lowella Grip M.D.   On: 04/13/2014 09:08   Ct Cervical Spine Wo Contrast  04/13/2014   CLINICAL DATA:  Patient fell at nursing, hitting left side of head. Pain  EXAM: CT HEAD WITHOUT CONTRAST  CT CERVICAL SPINE WITHOUT CONTRAST  TECHNIQUE: Multidetector CT imaging of the head and cervical spine was performed following the standard protocol without intravenous contrast. Multiplanar CT image reconstructions of the cervical spine were also generated.  COMPARISON:  Head CT March 18, 2014  FINDINGS: CT HEAD FINDINGS  There is mild diffuse atrophy, stable. There is no intracranial mass, hemorrhage, extra-axial fluid collection, or midline shift. There is slight small vessel disease in the centra semiovale bilaterally. Elsewhere gray-white compartments are within normal limits. There is no demonstrable acute infarct.  There is a sizable left frontal scalp hematoma, larger than on prior study. Bony calvarium appears intact. The mastoid air cells are clear.  CT CERVICAL SPINE FINDINGS  There is no fracture or spondylolisthesis. Prevertebral soft tissues and predental space regions are normal. There is moderate disc space narrowing at C5-6 and C7-T1. There is partial ankylosis at C2-3. There is multilevel facet hypertrophy. No disc extrusion or stenosis. Exit foraminal narrowing is marked at C5-6 on the right due to bony hypertrophy. Note that there is a bone island in the left posterior C6 vertebral body. There is calcification in each carotid artery.  IMPRESSION: CT head: Sizable left frontal scalp hematoma. No fracture. Mild diffuse atrophy with mild periventricular small vessel disease. No intracranial mass, hemorrhage, or extra-axial fluid collection. No acute appearing infarct appreciable.  CT cervical spine: Multilevel arthropathy. No fracture or appreciable spondylolisthesis. Bilateral carotid artery calcification.   Electronically Signed   By: Lowella Grip M.D.   On: 04/13/2014 09:08     EKG  Interpretation None      MDM   Final diagnoses:  Fall  Cephalohematoma  Hand laceration, left, initial encounter   At this time there does not appear to be any evidence of an acute emergency medical condition and the patient appears stable for discharge with appropriate outpatient follow up. Discussed findings with patient and duaghter  I personally performed the services described in this documentation, which was scribed in my presence.  The recorded information has been reviewed and is accurate.    Shelly Rank, MD 04/13/14 [phone]

## 2014-04-13 NOTE — ED Notes (Signed)
Pt here from [facility] for evaluation of fall. Pt states she slipped and fell. Complain of pain to left side of head and left hand. No loc

## 2014-04-13 NOTE — ED Notes (Signed)
Olivia Mackie at [facility] given report and informed patient was coming back to NH via Daughter. Patient with no complaints at this time. Respirations even and unlabored. Skin warm/dry. Discharge instructions reviewed with patient's daughter at this time. Patient's daughter given opportunity to voice concerns/ask questions. Patient discharged at this time and left Emergency Department via wheelchair.

## 2014-04-13 NOTE — Discharge Instructions (Signed)
Contusion A contusion is a deep bruise. Contusions are the result of an injury that caused bleeding under the skin. The contusion may turn blue, purple, or yellow. Minor injuries will give you a painless contusion, but more severe contusions may stay painful and swollen for a few weeks.  CAUSES  A contusion is usually caused by a blow, trauma, or direct force to an area of the body. SYMPTOMS   Swelling and redness of the injured area.  Bruising of the injured area.  Tenderness and soreness of the injured area.  Pain. DIAGNOSIS  The diagnosis can be made by taking a history and physical exam. An X-ray, CT scan, or MRI may be needed to determine if there were any associated injuries, such as fractures. TREATMENT  Specific treatment will depend on what area of the body was injured. In general, the best treatment for a contusion is resting, icing, elevating, and applying cold compresses to the injured area. Over-the-counter medicines may also be recommended for pain control. Ask your caregiver what the best treatment is for your contusion. HOME CARE INSTRUCTIONS   Put ice on the injured area.  Put ice in a plastic bag.  Place a towel between your skin and the bag.  Leave the ice on for 15-20 minutes, 3-4 times a day, or as directed by your health care provider.  Only take over-the-counter or prescription medicines for pain, discomfort, or fever as directed by your caregiver. Your caregiver may recommend avoiding anti-inflammatory medicines (aspirin, ibuprofen, and naproxen) for 48 hours because these medicines may increase bruising.  Rest the injured area.  If possible, elevate the injured area to reduce swelling. SEEK IMMEDIATE MEDICAL CARE IF:   You have increased bruising or swelling.  You have pain that is getting worse.  Your swelling or pain is not relieved with medicines. MAKE SURE YOU:   Understand these instructions.  Will watch your condition.  Will get help right  away if you are not doing well or get worse. Document Released: 01/09/2005 Document Revised: 04/06/2013 Document Reviewed: 02/04/2011 [facility] Patient Information 2015 [place], [place]. This information is not intended to replace advice given to you by your health care provider. Make sure you discuss any questions you have with your health care provider. Tissue Adhesive Wound Care Some cuts, wounds, lacerations, and incisions can be repaired by using tissue adhesive. Tissue adhesive is like glue. It holds the skin together, allowing for faster healing. It forms a strong bond on the skin in about 1 minute and reaches its full strength in about 2 or 3 minutes. The adhesive disappears naturally while the wound is healing. It is important to take proper care of your wound at home while it heals.  HOME CARE INSTRUCTIONS   Showers are allowed. Do not soak the area containing the tissue adhesive. Do not take baths, swim, or use hot tubs. Do not use any soaps or ointments on the wound. Certain ointments can weaken the glue.  If a bandage (dressing) has been applied, follow your health care provider's instructions for how often to change the dressing.   Keep the dressing dry if one has been applied.   Do not scratch, pick, or rub the adhesive.   Do not place tape over the adhesive. The adhesive could come off when pulling the tape off.   Protect the wound from further injury until it is healed.   Protect the wound from sun and tanning bed exposure while it is healing and for several  weeks after healing.   Only take over-the-counter or prescription medicines as directed by your health care provider.   Keep all follow-up appointments as directed by your health care provider. SEEK IMMEDIATE MEDICAL CARE IF:   Your wound becomes red, swollen, hot, or tender.   You develop a rash after the glue is applied.  You have increasing pain in the wound.   You have a red streak that goes away from  the wound.   You have pus coming from the wound.   You have increased bleeding.  You have a fever.  You have shaking chills.   You notice a bad smell coming from the wound.   Your wound or adhesive breaks open.  MAKE SURE YOU:   Understand these instructions.  Will watch your condition.  Will get help right away if you are not doing well or get worse. Document Released: 09/25/2000 Document Revised: 01/20/2013 Document Reviewed: 10/21/2012 [facility] Patient Information 2015 [place], [place]. This information is not intended to replace advice given to you by your health care provider. Make sure you discuss any questions you have with your health care provider.

## 2014-04-15 ENCOUNTER — Emergency Department (HOSPITAL_COMMUNITY)
Admission: EM | Admit: 2014-04-15 | Discharge: 2014-04-16 | Disposition: A | Discharge status: Home / Self Care | Payer: PPO | Attending: Emergency Medicine | Admitting: Emergency Medicine

## 2014-04-15 ENCOUNTER — Encounter (HOSPITAL_COMMUNITY): Payer: Self-pay | Admitting: Emergency Medicine

## 2014-04-15 ENCOUNTER — Emergency Department (HOSPITAL_COMMUNITY): Payer: PPO

## 2014-04-15 DIAGNOSIS — Z8669 Personal history of other diseases of the nervous system and sense organs: Secondary | ICD-10-CM | POA: Insufficient documentation

## 2014-04-15 DIAGNOSIS — R001 Bradycardia, unspecified: Secondary | ICD-10-CM | POA: Diagnosis not present

## 2014-04-15 DIAGNOSIS — S0003XA Contusion of scalp, initial encounter: Secondary | ICD-10-CM | POA: Diagnosis not present

## 2014-04-15 DIAGNOSIS — Y9389 Activity, other specified: Secondary | ICD-10-CM | POA: Diagnosis not present

## 2014-04-15 DIAGNOSIS — Y998 Other external cause status: Secondary | ICD-10-CM | POA: Insufficient documentation

## 2014-04-15 DIAGNOSIS — Z8719 Personal history of other diseases of the digestive system: Secondary | ICD-10-CM | POA: Diagnosis not present

## 2014-04-15 DIAGNOSIS — S0990XA Unspecified injury of head, initial encounter: Secondary | ICD-10-CM | POA: Insufficient documentation

## 2014-04-15 DIAGNOSIS — F0390 Unspecified dementia without behavioral disturbance: Secondary | ICD-10-CM | POA: Insufficient documentation

## 2014-04-15 DIAGNOSIS — W1839XA Other fall on same level, initial encounter: Secondary | ICD-10-CM | POA: Insufficient documentation

## 2014-04-15 DIAGNOSIS — Z7982 Long term (current) use of aspirin: Secondary | ICD-10-CM | POA: Diagnosis not present

## 2014-04-15 DIAGNOSIS — T148 Other injury of unspecified body region: Secondary | ICD-10-CM | POA: Diagnosis not present

## 2014-04-15 DIAGNOSIS — Y92128 Other place in nursing home as the place of occurrence of the external cause: Secondary | ICD-10-CM | POA: Diagnosis not present

## 2014-04-15 DIAGNOSIS — Z79899 Other long term (current) drug therapy: Secondary | ICD-10-CM | POA: Diagnosis not present

## 2014-04-15 DIAGNOSIS — I10 Essential (primary) hypertension: Secondary | ICD-10-CM | POA: Diagnosis not present

## 2014-04-15 DIAGNOSIS — Z87891 Personal history of nicotine dependence: Secondary | ICD-10-CM | POA: Insufficient documentation

## 2014-04-15 DIAGNOSIS — Z791 Long term (current) use of non-steroidal anti-inflammatories (NSAID): Secondary | ICD-10-CM | POA: Diagnosis not present

## 2014-04-15 DIAGNOSIS — T148XXA Other injury of unspecified body region, initial encounter: Secondary | ICD-10-CM

## 2014-04-15 LAB — CBG MONITORING, ED: Glucose-Capillary: 83 mg/dL (ref 70–99)

## 2014-04-15 MED ORDER — ACETAMINOPHEN 325 MG PO TABS
650.0000 mg | ORAL_TABLET | Freq: Once | ORAL | Status: AC
  Administered 2014-04-15: 650 mg via ORAL
  Filled 2014-04-15: qty 2

## 2014-04-15 NOTE — Discharge Instructions (Signed)
Please continue to have NH remind patient to use walker.   If you were given medicines take as directed.  If you are on coumadin or contraceptives realize their levels and effectiveness is altered by many different medicines.  If you have any reaction (rash, tongues swelling, other) to the medicines stop taking and see a physician.   Please follow up as directed and return to the ER or see a physician for new or worsening symptoms.  Thank you. Filed Vitals:   04/15/14 2121  BP: 135/42  Pulse: 50  Temp: 97.6 F (36.4 C)  TempSrc: Oral  Resp: 18  Height: 5' 0.5" (1.537 m)  Weight: 140 lb (63.504 kg)  SpO2: 94%

## 2014-04-15 NOTE — ED Notes (Signed)
Pt had unwitnessed fall at nursing facility, [facility].

## 2014-04-15 NOTE — ED Provider Notes (Signed)
CSN: [id number]     Arrival date & time 04/15/14  2116 History   First MD Initiated Contact with Patient 04/15/14 2120     Chief Complaint  Patient presents with  . Fall     (Consider location/radiation/quality/duration/timing/severity/associated sxs/prior Treatment) HPI Comments: 81 year old female with history of dementia, high blood pressure, frequent falls presents with acute head injury after unwitnessed fall at the nursing facility. Patient recently had similar episode. Patient has bruising/superficial wounds multiple to arm head face for which daughter says oral old except the left temple region appears more significant. Patient also complained about neck pain. No other obvious injuries, patient's post use a walker however rarely does.  Patient is a 81 y.o. female presenting with fall. The history is provided by the patient, a relative and the nursing home.  Fall    Past Medical History  Diagnosis Date  . Hypertension   . Peripheral neuropathy   . Dementia   . IBS (irritable bowel syndrome)   . Diverticulosis of sigmoid colon   . Hyperplastic polyps of stomach    Past Surgical History  Procedure Laterality Date  . Hemorrhoid surgery      at age 6  . Cholecystectomy    . Appendectomy    . Rotator cuff repair      right  . Abdominal hysterectomy    . Ankle surgery     Family History  Problem Relation Age of Onset  . Cancer    . Kidney failure     History  Substance Use Topics  . Smoking status: Former Smoker -- 0.25 packs/day for 30 years    Start date: 04/15/1946    Quit date: 04/15/1988  . Smokeless tobacco: Never Used  . Alcohol Use: Yes   OB History    No data available     Review of Systems  Unable to perform ROS: Dementia      Allergies  Codeine  Home Medications   Prior to Admission medications   Medication Sig Start Date End Date Taking? Authorizing Provider  acetaminophen (TYLENOL) 325 MG tablet Take 650 mg by mouth 2 (two) times daily.    Yes Historical Provider, MD  acetaminophen (TYLENOL) 650 MG CR tablet Take 650 mg by mouth every 6 (six) hours as needed for pain.   Yes Historical Provider, MD  aspirin EC 81 MG tablet Take 81 mg by mouth daily.   Yes Historical Provider, MD  calcium-vitamin D (OSCAL WITH D) 500-200 MG-UNIT per tablet Take 1 tablet by mouth 2 (two) times daily.   Yes Historical Provider, MD  donepezil (ARICEPT) 5 MG tablet Take 5 mg by mouth daily.   Yes Historical Provider, MD  lisinopril (PRINIVIL,ZESTRIL) 20 MG tablet Take 20 mg by mouth daily.   Yes Historical Provider, MD  LORazepam (ATIVAN) 1 MG tablet Take 1 mg by mouth every 6 (six) hours as needed for anxiety.   Yes Historical Provider, MD  magnesium hydroxide (MILK OF MAGNESIA) 400 MG/5ML suspension Take 30 mLs by mouth every 12 (twelve) hours as needed for mild constipation.   Yes Historical Provider, MD  Memantine HCl ER (NAMENDA XR) 28 MG CP24 Take 28 mg by mouth daily.   Yes Historical Provider, MD  Multiple Vitamin (MULTIVITAMIN WITH MINERALS) TABS tablet Take 1 tablet by mouth daily.   Yes Historical Provider, MD  ondansetron (ZOFRAN) 4 MG tablet Take 4 mg by mouth every 8 (eight) hours as needed for nausea or vomiting.   Yes Historical Provider,  MD  zolpidem (AMBIEN) 5 MG tablet Take 5 mg by mouth at bedtime.   Yes Historical Provider, MD   BP 135/42 mmHg  Pulse 50  Temp(Src) 97.6 F (36.4 C) (Oral)  Resp 18  Ht 5' 0.5" (1.537 m)  Wt 140 lb (63.504 kg)  BMI 26.88 kg/m2  SpO2 94% Physical Exam  Constitutional: She appears well-developed. No distress.  HENT:  Multiple areas of ecchymosis and superficial abrasion bilateral maxillary and most significant left temple region with large ecchymotic area/swollen and tender palpation. No obvious step-off appreciated. Full range of motion head neck with mild discomfort lower cervical paraspinal. No significant midline tenderness cervical, thoracic or lumbar spine.  Eyes: Pupils are equal, round,  and reactive to light.  Neck: Normal range of motion. Neck supple.  Cardiovascular: Bradycardia present.   Pulmonary/Chest: Effort normal. No respiratory distress.  Abdominal: Soft. There is no tenderness.  Musculoskeletal: She exhibits edema and tenderness.  Full range of motion bilateral hips and knees without discomfort/pain. Patient is mild discomfort left hand and wrists this is from a previous injury per daughter, superficial abrasion/laceration was previously repaired on the dorsum of the hand. No sign of infection.  Neurological: She is alert.  Patient pleasant dementia, pupils equal, horizontal eye movements intact, neck supple. Patient moves all extremities equal strength bilateral. Gross sensation intact bilateral.  Skin: Skin is warm.  Psychiatric:  Pleasant dementia  Nursing note and vitals reviewed.   ED Course  Procedures (including critical care time) Labs Review Labs Reviewed  CBG MONITORING, ED    Imaging Review Ct Head Wo Contrast  04/15/2014   CLINICAL DATA:  Multiple falls recently. Hematoma to the left side of the head. Old bruises on the left face. Dizziness, weakness, altered mental status. Left-sided neck pain. Headache. Posterior neck pain. Acute head injury.  EXAM: CT HEAD WITHOUT CONTRAST  CT CERVICAL SPINE WITHOUT CONTRAST  TECHNIQUE: Multidetector CT imaging of the head and cervical spine was performed following the standard protocol without intravenous contrast. Multiplanar CT image reconstructions of the cervical spine were also generated.  COMPARISON:  CT head and cervical spine 04/13/2014.  FINDINGS: CT HEAD FINDINGS  Subcutaneous scalp hematoma again demonstrated over the left frontal region similar prior study. Mild diffuse cerebral atrophy. Mild ventricular dilatation consistent with central atrophy. Patchy low-attenuation change in the deep white matter consistent with small vessel ischemia. No mass effect or midline shift. No abnormal extra-axial fluid  collections. Gray-white matter junctions are distinct. Basal cisterns are not effaced. No evidence of acute intracranial hemorrhage. No depressed skull fractures. Visualized paranasal sinuses and mastoid air cells are not opacified. Vascular calcifications.  CT CERVICAL SPINE FINDINGS  Straightening of cervical lordosis which may be due to patient positioning but ligamentous injury or muscle spasm could also have this appearance and are not excluded. Diffuse degenerative change throughout the cervical spine with narrowed cervical interspaces and associated endplate hypertrophic changes. Degenerative changes throughout the cervical facet joints. C1-2 articulation appears intact. No vertebral compression deformities. No prevertebral soft tissue swelling. No focal bone lesion or bone destruction. Bone cortex and trabecular architecture appear intact. Bilateral apical pleural scarring is demonstrated. Vascular calcifications in the cervical carotid arteries. Left thyroid nodule measures 1.9 x 1.4 cm. Consider further evaluation with ultrasound and elective setting.  IMPRESSION: No acute intracranial abnormalities. Chronic atrophy and small vessel ischemic changes. Left subcutaneous scalp hematoma again demonstrated without change.  Nonspecific straightening of the usual cervical lordosis, unchanged since prior study. Diffuse degenerative change in  the cervical spine. No displaced fractures identified.   Electronically Signed   By: Lucienne Capers M.D.   On: 04/15/2014 22:40   Ct Cervical Spine Wo Contrast  04/15/2014   CLINICAL DATA:  Multiple falls recently. Hematoma to the left side of the head. Old bruises on the left face. Dizziness, weakness, altered mental status. Left-sided neck pain. Headache. Posterior neck pain. Acute head injury.  EXAM: CT HEAD WITHOUT CONTRAST  CT CERVICAL SPINE WITHOUT CONTRAST  TECHNIQUE: Multidetector CT imaging of the head and cervical spine was performed following the standard  protocol without intravenous contrast. Multiplanar CT image reconstructions of the cervical spine were also generated.  COMPARISON:  CT head and cervical spine 04/13/2014.  FINDINGS: CT HEAD FINDINGS  Subcutaneous scalp hematoma again demonstrated over the left frontal region similar prior study. Mild diffuse cerebral atrophy. Mild ventricular dilatation consistent with central atrophy. Patchy low-attenuation change in the deep white matter consistent with small vessel ischemia. No mass effect or midline shift. No abnormal extra-axial fluid collections. Gray-white matter junctions are distinct. Basal cisterns are not effaced. No evidence of acute intracranial hemorrhage. No depressed skull fractures. Visualized paranasal sinuses and mastoid air cells are not opacified. Vascular calcifications.  CT CERVICAL SPINE FINDINGS  Straightening of cervical lordosis which may be due to patient positioning but ligamentous injury or muscle spasm could also have this appearance and are not excluded. Diffuse degenerative change throughout the cervical spine with narrowed cervical interspaces and associated endplate hypertrophic changes. Degenerative changes throughout the cervical facet joints. C1-2 articulation appears intact. No vertebral compression deformities. No prevertebral soft tissue swelling. No focal bone lesion or bone destruction. Bone cortex and trabecular architecture appear intact. Bilateral apical pleural scarring is demonstrated. Vascular calcifications in the cervical carotid arteries. Left thyroid nodule measures 1.9 x 1.4 cm. Consider further evaluation with ultrasound and elective setting.  IMPRESSION: No acute intracranial abnormalities. Chronic atrophy and small vessel ischemic changes. Left subcutaneous scalp hematoma again demonstrated without change.  Nonspecific straightening of the usual cervical lordosis, unchanged since prior study. Diffuse degenerative change in the cervical spine. No displaced  fractures identified.   Electronically Signed   By: Lucienne Capers M.D.   On: 04/15/2014 22:40     EKG Interpretation None      MDM   Final diagnoses:  Acute head injury  Scalp hematoma, initial encounter  Skin abrasion   Patient with dementia history and frequent falls presents after unwitnessed fall and evidence of head and neck injury. With age in unwitnessed fall and dementia history plan for CT head and neck, Tylenol for pain. Pt signed out to follow up CT results.  Discussed plan with the daughter   Medications  acetaminophen (TYLENOL) tablet 650 mg (650 mg Oral Given 04/15/14 2144)    Filed Vitals:   04/15/14 2121  BP: 135/42  Pulse: 50  Temp: 97.6 F (36.4 C)  TempSrc: Oral  Resp: 18  Height: 5' 0.5" (1.537 m)  Weight: 140 lb (63.504 kg)  SpO2: 94%    Final diagnoses:  Acute head injury  Scalp hematoma, initial encounter  Skin abrasion  Scalp hematoma     Mariea Clonts, MD 04/17/14 [phone]

## 2014-06-13 ENCOUNTER — Encounter: Payer: Self-pay | Admitting: Gastroenterology

## 2014-06-27 ENCOUNTER — Telehealth: Payer: Self-pay

## 2014-06-27 NOTE — Telephone Encounter (Signed)
Pt's daughter called to inform us that she does not want to have anymore TCS.

## 2014-06-28 NOTE — Telephone Encounter (Signed)
REVIEWED-NO ADDITIONAL RECOMMENDATIONS. 

## 2014-11-28 ENCOUNTER — Emergency Department (HOSPITAL_COMMUNITY)
Admission: EM | Admit: 2014-11-28 | Discharge: 2014-11-28 | Disposition: A | Discharge status: Home / Self Care | Payer: PPO | Attending: Emergency Medicine | Admitting: Emergency Medicine

## 2014-11-28 ENCOUNTER — Emergency Department (HOSPITAL_COMMUNITY): Payer: PPO

## 2014-11-28 ENCOUNTER — Encounter (HOSPITAL_COMMUNITY): Payer: Self-pay | Admitting: *Deleted

## 2014-11-28 DIAGNOSIS — Z79899 Other long term (current) drug therapy: Secondary | ICD-10-CM | POA: Insufficient documentation

## 2014-11-28 DIAGNOSIS — I10 Essential (primary) hypertension: Secondary | ICD-10-CM | POA: Diagnosis not present

## 2014-11-28 DIAGNOSIS — Z87891 Personal history of nicotine dependence: Secondary | ICD-10-CM | POA: Insufficient documentation

## 2014-11-28 DIAGNOSIS — S59902A Unspecified injury of left elbow, initial encounter: Secondary | ICD-10-CM | POA: Diagnosis present

## 2014-11-28 DIAGNOSIS — Y998 Other external cause status: Secondary | ICD-10-CM | POA: Diagnosis not present

## 2014-11-28 DIAGNOSIS — Z7982 Long term (current) use of aspirin: Secondary | ICD-10-CM | POA: Insufficient documentation

## 2014-11-28 DIAGNOSIS — Z8719 Personal history of other diseases of the digestive system: Secondary | ICD-10-CM | POA: Diagnosis not present

## 2014-11-28 DIAGNOSIS — W010XXA Fall on same level from slipping, tripping and stumbling without subsequent striking against object, initial encounter: Secondary | ICD-10-CM | POA: Insufficient documentation

## 2014-11-28 DIAGNOSIS — Y92129 Unspecified place in nursing home as the place of occurrence of the external cause: Secondary | ICD-10-CM | POA: Diagnosis not present

## 2014-11-28 DIAGNOSIS — F0390 Unspecified dementia without behavioral disturbance: Secondary | ICD-10-CM | POA: Insufficient documentation

## 2014-11-28 DIAGNOSIS — Z8669 Personal history of other diseases of the nervous system and sense organs: Secondary | ICD-10-CM | POA: Diagnosis not present

## 2014-11-28 DIAGNOSIS — S199XXA Unspecified injury of neck, initial encounter: Secondary | ICD-10-CM | POA: Insufficient documentation

## 2014-11-28 DIAGNOSIS — W19XXXA Unspecified fall, initial encounter: Secondary | ICD-10-CM

## 2014-11-28 DIAGNOSIS — S50312A Abrasion of left elbow, initial encounter: Secondary | ICD-10-CM | POA: Diagnosis not present

## 2014-11-28 DIAGNOSIS — Y9389 Activity, other specified: Secondary | ICD-10-CM | POA: Insufficient documentation

## 2014-11-28 NOTE — Discharge Instructions (Signed)

## 2014-11-28 NOTE — ED Provider Notes (Signed)
CSN: [id number]     Arrival date & time 11/28/14  0123 History   First MD Initiated Contact with Patient 11/28/14 0243     Chief Complaint  Patient presents with  . Fall     (Consider location/radiation/quality/duration/timing/severity/associated sxs/prior Treatment) Patient is a 81 y.o. female presenting with fall. The history is provided by the patient. The history is limited by the condition of the patient (Dementia).  Fall  She was reported to a fallen at the nursing home where she resides. Nursing reports stated that she lost her balance. Patient states she slipped on a wet floor. She states that she is hurting all over. EMS reported complaints of back and hip pain. Stiff cervical collar was applied by EMS.  Past Medical History  Diagnosis Date  . Hypertension   . Peripheral neuropathy   . Dementia   . IBS (irritable bowel syndrome)   . Diverticulosis of sigmoid colon   . Hyperplastic polyps of stomach    Past Surgical History  Procedure Laterality Date  . Hemorrhoid surgery      at age 57  . Cholecystectomy    . Appendectomy    . Rotator cuff repair      right  . Abdominal hysterectomy    . Ankle surgery     Family History  Problem Relation Age of Onset  . Cancer    . Kidney failure     Social History  Substance Use Topics  . Smoking status: Former Smoker -- 0.25 packs/day for 30 years    Start date: 04/15/1946    Quit date: 04/15/1988  . Smokeless tobacco: Never Used  . Alcohol Use: Yes   OB History    No data available     Review of Systems  Unable to perform ROS: Dementia  All other systems reviewed and are negative.     Allergies  Codeine  Home Medications   Prior to Admission medications   Medication Sig Start Date End Date Taking? Authorizing Provider  acetaminophen (TYLENOL) 325 MG tablet Take 650 mg by mouth 2 (two) times daily.    Historical Provider, MD  acetaminophen (TYLENOL) 650 MG CR tablet Take 650 mg by mouth every 6 (six) hours  as needed for pain.    Historical Provider, MD  aspirin EC 81 MG tablet Take 81 mg by mouth daily.    Historical Provider, MD  calcium-vitamin D (OSCAL WITH D) 500-200 MG-UNIT per tablet Take 1 tablet by mouth 2 (two) times daily.    Historical Provider, MD  donepezil (ARICEPT) 5 MG tablet Take 5 mg by mouth daily.    Historical Provider, MD  lisinopril (PRINIVIL,ZESTRIL) 20 MG tablet Take 20 mg by mouth daily.    Historical Provider, MD  LORazepam (ATIVAN) 1 MG tablet Take 1 mg by mouth every 6 (six) hours as needed for anxiety.    Historical Provider, MD  magnesium hydroxide (MILK OF MAGNESIA) 400 MG/5ML suspension Take 30 mLs by mouth every 12 (twelve) hours as needed for mild constipation.    Historical Provider, MD  Memantine HCl ER (NAMENDA XR) 28 MG CP24 Take 28 mg by mouth daily.    Historical Provider, MD  Multiple Vitamin (MULTIVITAMIN WITH MINERALS) TABS tablet Take 1 tablet by mouth daily.    Historical Provider, MD  ondansetron (ZOFRAN) 4 MG tablet Take 4 mg by mouth every 8 (eight) hours as needed for nausea or vomiting.    Historical Provider, MD  zolpidem (AMBIEN) 5 MG tablet Take  5 mg by mouth at bedtime.    Historical Provider, MD   BP 169/53 mmHg  Pulse 53  Temp(Src) 98.1 F (36.7 C) (Oral)  Resp 16  Ht 5\' 2"  (1.575 m)  Wt 130 lb (58.968 kg)  BMI 23.77 kg/m2  SpO2 99% Physical Exam  Nursing note and vitals reviewed.  81 year old female, resting comfortably and in no acute distress. Vital signs are significant for bradycardia and hypertension. Oxygen saturation is 99%, which is normal. Head is normocephalic and atraumatic. PERRLA, EOMI. Oropharynx is clear. Neck is mildly tender in the midline posteriorly. There is no adenopathy or JVD. Back is nontender and there is no CVA tenderness. Lungs are clear without rales, wheezes, or rhonchi. Chest is nontender. Heart has regular rate and rhythm without murmur. Abdomen is soft, flat, nontender without masses or  hepatosplenomegaly and peristalsis is normoactive.  Extremities have no cyanosis or edema, full range of motion is present. Specifically, there is no tenderness in either hip and there is full passive range of motion of both hips without pain. Abrasion is present on the left elbow. Skin is warm and dry without rash. Neurologic: Mental status is normal, cranial nerves are intact, there are no motor or sensory deficits.  ED Course  Procedures (including critical care time) Imaging Review Ct Head Wo Contrast  11/28/2014   CLINICAL DATA:  Fall with back pain.  Initial encounter.  EXAM: CT HEAD WITHOUT CONTRAST  CT CERVICAL SPINE WITHOUT CONTRAST  TECHNIQUE: Multidetector CT imaging of the head and cervical spine was performed following the standard protocol without intravenous contrast. Multiplanar CT image reconstructions of the cervical spine were also generated.  COMPARISON:  04/15/2014  FINDINGS: CT HEAD FINDINGS  Skull and Sinuses:Negative for fracture or destructive process. The mastoids, middle ears, and imaged paranasal sinuses are clear.  Orbits: No acute abnormality.  Brain: No evidence of acute infarction, hemorrhage, hydrocephalus, or mass lesion/mass effect.  Mild chronic small vessel disease with ischemic gliosis noted around the lateral ventricles. Age expected generalized cortical atrophy.  CT CERVICAL SPINE FINDINGS  Negative for acute fracture or subluxation. No prevertebral edema. No gross cervical canal hematoma.  Diffuse spondylosis and disc narrowing with multilevel facet arthropathy. Chronic mild C7-T1 anterolisthesis associated with facet degeneration. No significant osseous canal or foraminal stenosis.  A 2 cm nodule in the left thyroid gland is stable from CTA neck in 2012, as are calcified nonenlarged lymph nodes in the left cervical chain.  IMPRESSION: No evidence of intracranial or cervical spine injury.   Electronically Signed   By: Monte Fantasia M.D.   On: 11/28/2014 03:23    Ct Cervical Spine Wo Contrast  11/28/2014   CLINICAL DATA:  Fall with back pain.  Initial encounter.  EXAM: CT HEAD WITHOUT CONTRAST  CT CERVICAL SPINE WITHOUT CONTRAST  TECHNIQUE: Multidetector CT imaging of the head and cervical spine was performed following the standard protocol without intravenous contrast. Multiplanar CT image reconstructions of the cervical spine were also generated.  COMPARISON:  04/15/2014  FINDINGS: CT HEAD FINDINGS  Skull and Sinuses:Negative for fracture or destructive process. The mastoids, middle ears, and imaged paranasal sinuses are clear.  Orbits: No acute abnormality.  Brain: No evidence of acute infarction, hemorrhage, hydrocephalus, or mass lesion/mass effect.  Mild chronic small vessel disease with ischemic gliosis noted around the lateral ventricles. Age expected generalized cortical atrophy.  CT CERVICAL SPINE FINDINGS  Negative for acute fracture or subluxation. No prevertebral edema. No gross cervical canal hematoma.  Diffuse spondylosis and disc narrowing with multilevel facet arthropathy. Chronic mild C7-T1 anterolisthesis associated with facet degeneration. No significant osseous canal or foraminal stenosis.  A 2 cm nodule in the left thyroid gland is stable from CTA neck in 2012, as are calcified nonenlarged lymph nodes in the left cervical chain.  IMPRESSION: No evidence of intracranial or cervical spine injury.   Electronically Signed   By: Monte Fantasia M.D.   On: 67/59/1638 46:65   I, Concepcion Kirkpatrick, personally reviewed and evaluated these images results as part of my medical decision-making.   MDM   Final diagnoses:  Fall at nursing home, initial encounter    Fall without significant injury. Minor abrasion present left elbow. She will be sent for CT of head and cervical spine, but no indication for other x-rays. Old records are reviewed and she does have a prior ED visit for a fall.  CT scans are unremarkable. She is discharged back to her nursing  care facility.  Delora Fuel, MD 99/35/70 [id number]

## 2014-11-28 NOTE — ED Notes (Addendum)
Pt arrived to er via [facility] after losing her balance and falling at [facility], pt arrived to er c/o back, hip pain, has c-collar in place via [place], pt also has abrasion noted to left elbow, dressing in place by ems,

## 2014-11-28 NOTE — ED Notes (Signed)
Gave report to Marisue Humble at [facility], they are unable to pick up patient until 7am, patient will stay in ED until then.

## 2014-12-29 ENCOUNTER — Emergency Department (HOSPITAL_COMMUNITY): Payer: PPO

## 2014-12-29 ENCOUNTER — Emergency Department (HOSPITAL_COMMUNITY)
Admission: EM | Admit: 2014-12-29 | Discharge: 2014-12-29 | Disposition: A | Discharge status: Home / Self Care | Payer: PPO | Attending: Emergency Medicine | Admitting: Emergency Medicine

## 2014-12-29 ENCOUNTER — Encounter (HOSPITAL_COMMUNITY): Payer: Self-pay

## 2014-12-29 DIAGNOSIS — Z7982 Long term (current) use of aspirin: Secondary | ICD-10-CM | POA: Diagnosis not present

## 2014-12-29 DIAGNOSIS — S3992XA Unspecified injury of lower back, initial encounter: Secondary | ICD-10-CM | POA: Diagnosis present

## 2014-12-29 DIAGNOSIS — S99912A Unspecified injury of left ankle, initial encounter: Secondary | ICD-10-CM | POA: Insufficient documentation

## 2014-12-29 DIAGNOSIS — Y9389 Activity, other specified: Secondary | ICD-10-CM | POA: Insufficient documentation

## 2014-12-29 DIAGNOSIS — T148 Other injury of unspecified body region: Secondary | ICD-10-CM | POA: Diagnosis not present

## 2014-12-29 DIAGNOSIS — Y92129 Unspecified place in nursing home as the place of occurrence of the external cause: Secondary | ICD-10-CM | POA: Insufficient documentation

## 2014-12-29 DIAGNOSIS — Y998 Other external cause status: Secondary | ICD-10-CM | POA: Diagnosis not present

## 2014-12-29 DIAGNOSIS — I10 Essential (primary) hypertension: Secondary | ICD-10-CM | POA: Insufficient documentation

## 2014-12-29 DIAGNOSIS — W1839XA Other fall on same level, initial encounter: Secondary | ICD-10-CM | POA: Insufficient documentation

## 2014-12-29 DIAGNOSIS — G309 Alzheimer's disease, unspecified: Secondary | ICD-10-CM | POA: Insufficient documentation

## 2014-12-29 DIAGNOSIS — F0280 Dementia in other diseases classified elsewhere without behavioral disturbance: Secondary | ICD-10-CM | POA: Diagnosis not present

## 2014-12-29 DIAGNOSIS — S3991XA Unspecified injury of abdomen, initial encounter: Secondary | ICD-10-CM | POA: Insufficient documentation

## 2014-12-29 DIAGNOSIS — Z87891 Personal history of nicotine dependence: Secondary | ICD-10-CM | POA: Insufficient documentation

## 2014-12-29 DIAGNOSIS — Z8719 Personal history of other diseases of the digestive system: Secondary | ICD-10-CM | POA: Diagnosis not present

## 2014-12-29 DIAGNOSIS — T07XXXA Unspecified multiple injuries, initial encounter: Secondary | ICD-10-CM

## 2014-12-29 DIAGNOSIS — W19XXXA Unspecified fall, initial encounter: Secondary | ICD-10-CM

## 2014-12-29 DIAGNOSIS — Z79899 Other long term (current) drug therapy: Secondary | ICD-10-CM | POA: Diagnosis not present

## 2014-12-29 MED ORDER — ACETAMINOPHEN 325 MG PO TABS
650.0000 mg | ORAL_TABLET | Freq: Once | ORAL | Status: AC
  Administered 2014-12-29: 650 mg via ORAL
  Filled 2014-12-29: qty 2

## 2014-12-29 NOTE — ED Provider Notes (Signed)
CSN: [id number]     Arrival date & time 12/29/14  3976 History  This chart was scribed for Ezequiel Essex, MD by Irene Pap, ED Scribe. This patient was seen in room APA14/[facility]14 and patient care was started at 9:12 AM.   Chief Complaint  Patient presents with  . Fall   The history is provided by a relative. No language interpreter was used.  HPI Comments (Level 5 Caveat due to Dementia): SEFERINA BROKAW is a 81 y.o. Female with hx of dementia, Alzheimer's, IBS, peripheral neuropathy, HTN and frequent falls who presents to the Emergency Department complaining of a fall onset 8 hours ago. Per daughter, pt fell at [place] [facility] home last night and was found on her knees by roommates. Daughter states that she was told the pt was complaining of hip and back pain. Pt fell two days ago, resulting in skin tears. Daughter states that the pt has a hx of back problems and uses a walker to ambulate. Daughter states that the pt has been eating and drinking normally; ate before coming to the ED. States that the pt's confusion is baseline. Pt currently complains of back pain, abdominal pain and left ankle pain, but states that the abdominal pain is normal. [place] denies pt hit head or LOC, but the fall was unwitnessed. Denies hx of back surgeries. Pt takes 81 mg aspirin, Tylenol, and Vesicare for incontinence.   Past Medical History  Diagnosis Date  . Hypertension   . Peripheral neuropathy   . Dementia   . IBS (irritable bowel syndrome)   . Diverticulosis of sigmoid colon   . Hyperplastic polyps of stomach    Past Surgical History  Procedure Laterality Date  . Hemorrhoid surgery      at age 59  . Cholecystectomy    . Appendectomy    . Rotator cuff repair      right  . Abdominal hysterectomy    . Ankle surgery     Family History  Problem Relation Age of Onset  . Cancer    . Kidney failure     Social History  Substance Use Topics  . Smoking status: Former Smoker -- 0.25 packs/day for  30 years    Start date: 04/15/1946    Quit date: 04/15/1988  . Smokeless tobacco: Never Used  . Alcohol Use: Yes   OB History    No data available     Review of Systems  Unable to perform ROS: Dementia   Allergies  Codeine  Home Medications   Prior to Admission medications   Medication Sig Start Date End Date Taking? Authorizing Provider  acetaminophen (TYLENOL) 325 MG tablet Take 650 mg by mouth every 6 (six) hours as needed for moderate pain.    Yes Historical Provider, MD  acetaminophen (TYLENOL) 650 MG CR tablet Take 1,300 mg by mouth 2 (two) times daily.    Yes Historical Provider, MD  aspirin EC 81 MG tablet Take 81 mg by mouth daily.   Yes Historical Provider, MD  calcium-vitamin D (OSCAL WITH D) 500-200 MG-UNIT per tablet Take 1 tablet by mouth 2 (two) times daily.   Yes Historical Provider, MD  donepezil (ARICEPT) 10 MG tablet Take 10 mg by mouth daily.   Yes Historical Provider, MD  donepezil (ARICEPT) 5 MG tablet Take 5 mg by mouth daily.   Yes Historical Provider, MD  lisinopril (PRINIVIL,ZESTRIL) 20 MG tablet Take 20 mg by mouth daily.   Yes Historical Provider, MD  magnesium hydroxide (  MILK OF MAGNESIA) 400 MG/5ML suspension Take 30 mLs by mouth every 12 (twelve) hours as needed for mild constipation.   Yes Historical Provider, MD  memantine (NAMENDA) 10 MG tablet Take 10 mg by mouth every evening.   Yes Historical Provider, MD  mirtazapine (REMERON) 15 MG tablet Take 7.5 mg by mouth at bedtime.   Yes Historical Provider, MD  Multiple Vitamin (MULTIVITAMIN WITH MINERALS) TABS tablet Take 1 tablet by mouth daily.   Yes Historical Provider, MD  senna-docusate (SENNA S) 8.6-50 MG per tablet Take 1 tablet by mouth daily.   Yes Historical Provider, MD  solifenacin (VESICARE) 10 MG tablet Take 10 mg by mouth daily.   Yes Historical Provider, MD   BP 151/51 mmHg  Pulse 58  Temp(Src) 98.2 F (36.8 C) (Oral)  Resp 16  SpO2 96%  Physical Exam  Constitutional: She appears  well-developed and well-nourished. No distress.  HENT:  Head: Normocephalic and atraumatic.  Mouth/Throat: Oropharynx is clear and moist. No oropharyngeal exudate.  Erythema to forehead  Eyes: Conjunctivae and EOM are normal. Pupils are equal, round, and reactive to light.  Neck: Normal range of motion. Neck supple.  No meningismus.  Cardiovascular: Normal rate, regular rhythm, normal heart sounds and intact distal pulses.   No murmur heard. Pulmonary/Chest: Effort normal and breath sounds normal. No respiratory distress.  Abdominal: Soft. There is tenderness. There is no rebound and no guarding.  Mild diffuse tenderness, no guarding or rebound  Musculoskeletal: Normal range of motion. She exhibits no edema or tenderness.  Full ROM of hips bilaterally. Paraspinal lumbar tenderness. No midline or C-Spine tenderness.   Neurological:  Cranial nerves 2-12 intact, 5/5 strength throughout, able to lift legs off bed.   Skin: Skin is warm.  Psychiatric: She has a normal mood and affect. Her behavior is normal.  Nursing note and vitals reviewed.   ED Course  Procedures (including critical care time) DIAGNOSTIC STUDIES: Oxygen Saturation is 96% on RA, normal by my interpretation.    COORDINATION OF CARE: 9:25 AM-Discussed treatment plan which includes x-rays with daughter at bedside and daughter agreed to plan.   Labs Review Labs Reviewed - No data to display  Imaging Review Dg Lumbar Spine Complete  12/29/2014   CLINICAL DATA:  [place] this morning with lumbar spine pain  EXAM: LUMBAR SPINE - COMPLETE 4+ VIEW  COMPARISON:  None.  FINDINGS: Diffuse fecal retention throughout the colon. No evidence obstruction. Large anterior osteophytes from L2 through L5. Disc space height fairly well maintained. L4-5 and L5-S1 facet arthropathy with very mild anterior listhesis at these levels. Extensive aortoiliac calcification. No fracture.  IMPRESSION: No acute findings.  Degenerative changes.   Constipation.   Electronically Signed   By: Skipper Cliche M.D.   On: 12/29/2014 10:11   Dg Pelvis 1-2 Views  12/29/2014   CLINICAL DATA:  Pelvic pain,  She fell this morning.  EXAM: PELVIS - 1-2 VIEW  COMPARISON:  07/14/2013  FINDINGS: Diffuse osteopenia with no fracture. Mild pubic symphysitis. 14 mm sclerotic lesion proximal left femur stable.  IMPRESSION: No acute findings   Electronically Signed   By: Skipper Cliche M.D.   On: 12/29/2014 10:15   Ct Head Wo Contrast  12/29/2014   CLINICAL DATA:  Fall last night, found by roommate on knees. Denies head injury or loss of consciousness. History of frequent falls. Initial encounter.  EXAM: CT HEAD WITHOUT CONTRAST  CT CERVICAL SPINE WITHOUT CONTRAST  TECHNIQUE: Multidetector CT imaging of the head  and cervical spine was performed following the standard protocol without intravenous contrast. Multiplanar CT image reconstructions of the cervical spine were also generated.  COMPARISON:  11/28/2014  FINDINGS: CT HEAD FINDINGS  Mild generalized cerebral atrophy is within normal limits for age. Periventricular white-matter hypodensities are unchanged and nonspecific but compatible with mild chronic small vessel ischemic disease. There is no evidence of acute cortical infarct, intracranial hemorrhage, mass, midline shift, or extra-axial fluid collection.  Orbits are unremarkable. Visualized paranasal sinuses and mastoid air cells are clear. No skull fracture is identified.  CT CERVICAL SPINE FINDINGS  Vertebral alignment is unchanged with slight facet mediated anterolisthesis of C7 on T1. Vertebral body heights are preserved. No acute cervical spine fracture is identified.  Multilevel disc space narrowing is present, mild-to-moderate at C5-6. Diffuse endplate osteophytosis is present throughout the mid and lower cervical spine, and there is moderate to severe multilevel facet arthrosis with bilateral facet ankylosis at C2-3. 2.1 cm left thyroid nodule is unchanged.  Tiny calcified lymph nodes are again noted in the left neck. Retropharyngeal course of the proximal left ICA is noted.  IMPRESSION: 1. No evidence of acute intracranial abnormality. 2. No acute osseous abnormality identified in the cervical spine.   Electronically Signed   By: Logan Bores M.D.   On: 12/29/2014 11:01   Ct Cervical Spine Wo Contrast  12/29/2014   CLINICAL DATA:  Fall last night, found by roommate on knees. Denies head injury or loss of consciousness. History of frequent falls. Initial encounter.  EXAM: CT HEAD WITHOUT CONTRAST  CT CERVICAL SPINE WITHOUT CONTRAST  TECHNIQUE: Multidetector CT imaging of the head and cervical spine was performed following the standard protocol without intravenous contrast. Multiplanar CT image reconstructions of the cervical spine were also generated.  COMPARISON:  11/28/2014  FINDINGS: CT HEAD FINDINGS  Mild generalized cerebral atrophy is within normal limits for age. Periventricular white-matter hypodensities are unchanged and nonspecific but compatible with mild chronic small vessel ischemic disease. There is no evidence of acute cortical infarct, intracranial hemorrhage, mass, midline shift, or extra-axial fluid collection.  Orbits are unremarkable. Visualized paranasal sinuses and mastoid air cells are clear. No skull fracture is identified.  CT CERVICAL SPINE FINDINGS  Vertebral alignment is unchanged with slight facet mediated anterolisthesis of C7 on T1. Vertebral body heights are preserved. No acute cervical spine fracture is identified.  Multilevel disc space narrowing is present, mild-to-moderate at C5-6. Diffuse endplate osteophytosis is present throughout the mid and lower cervical spine, and there is moderate to severe multilevel facet arthrosis with bilateral facet ankylosis at C2-3. 2.1 cm left thyroid nodule is unchanged. Tiny calcified lymph nodes are again noted in the left neck. Retropharyngeal course of the proximal left ICA is noted.   IMPRESSION: 1. No evidence of acute intracranial abnormality. 2. No acute osseous abnormality identified in the cervical spine.   Electronically Signed   By: Logan Bores M.D.   On: 12/29/2014 11:01      EKG Interpretation None      MDM   Final diagnoses:  Fall, initial encounter  Multiple contusions   From nursing home after unwitnessed fall. Patient with dementia at mental status baseline. Unknown head trauma.  Patient found her knees complaining of back pain. There is a area of erythema to her head which daughter states is due to stress.  Patient daughter initially did not want head imaging as she states she does not think her to mother hit her head. I explained it is necessary  given her dementia she cannot tells happened and does not recall the fall. She also has a red mark on her head which appears to be from trauma.  Imaging negative for acute traumatic injury. Patient able to ambulate with walker which is baseline. She states her back pain has resolved. She is at her normal mental status per her daughter. She appears stable to return to facility.   I personally performed the services described in this documentation, which was scribed in my presence. The recorded information has been reviewed and is accurate.    Ezequiel Essex, MD 12/29/14 1535

## 2014-12-29 NOTE — ED Notes (Signed)
Pt's daughter and POA questioning need for CT and Xray scans. Explained to patient and POA that since pt had an unwitnessed fall and unable to recall events, it was important to rule out life-threatening conditions. Daughter verbalized understanding.

## 2014-12-29 NOTE — ED Notes (Signed)
Gave pt diet coke to drink, pt does not appear to have any difficulty at this time.

## 2014-12-29 NOTE — ED Notes (Signed)
Ambulated pt with use of personal walker. Pt's gait was equal, even, and steady.

## 2014-12-29 NOTE — ED Notes (Signed)
Per Pt's daughter, Pt fell at [facility] last night. Denies hitting head or LOC. Roommates found pt on knees. Takes 81mg  Aspirin. Hx of frequent falls.

## 2014-12-29 NOTE — Discharge Instructions (Signed)
Fall Prevention and Home Safety Your imaging is negative for a serious injury. Followup with your doctor. Return to the ED if you develop new or worsening symptoms. Falls cause injuries and can affect all age groups. It is possible to use preventive measures to significantly decrease the likelihood of falls. There are many simple measures which can make your home safer and prevent falls. OUTDOORS  Repair cracks and edges of walkways and driveways.  Remove high doorway thresholds.  Trim shrubbery on the main path into your home.  Have good outside lighting.  Clear walkways of tools, rocks, debris, and clutter.  Check that handrails are not broken and are securely fastened. Both sides of steps should have handrails.  Have leaves, snow, and ice cleared regularly.  Use sand or salt on walkways during winter months.  In the garage, clean up grease or oil spills. BATHROOM  Install night lights.  Install grab bars by the toilet and in the tub and shower.  Use non-skid mats or decals in the tub or shower.  Place a plastic non-slip stool in the shower to sit on, if needed.  Keep floors dry and clean up all water on the floor immediately.  Remove soap buildup in the tub or shower on a regular basis.  Secure bath mats with non-slip, double-sided rug tape.  Remove throw rugs and tripping hazards from the floors. BEDROOMS  Install night lights.  Make sure a bedside light is easy to reach.  Do not use oversized bedding.  Keep a telephone by your bedside.  Have a firm chair with side arms to use for getting dressed.  Remove throw rugs and tripping hazards from the floor. KITCHEN  Keep handles on pots and pans turned toward the center of the stove. Use back burners when possible.  Clean up spills quickly and allow time for drying.  Avoid walking on wet floors.  Avoid hot utensils and knives.  Position shelves so they are not too high or low.  Place commonly used  objects within easy reach.  If necessary, use a sturdy step stool with a grab bar when reaching.  Keep electrical cables out of the way.  Do not use floor polish or wax that makes floors slippery. If you must use wax, use non-skid floor wax.  Remove throw rugs and tripping hazards from the floor. STAIRWAYS  Never leave objects on stairs.  Place handrails on both sides of stairways and use them. Fix any loose handrails. Make sure handrails on both sides of the stairways are as long as the stairs.  Check carpeting to make sure it is firmly attached along stairs. Make repairs to worn or loose carpet promptly.  Avoid placing throw rugs at the top or bottom of stairways, or properly secure the rug with carpet tape to prevent slippage. Get rid of throw rugs, if possible.  Have an electrician put in a light switch at the top and bottom of the stairs. OTHER FALL PREVENTION TIPS  Wear low-heel or rubber-soled shoes that are supportive and fit well. Wear closed toe shoes.  When using a stepladder, make sure it is fully opened and both spreaders are firmly locked. Do not climb a closed stepladder.  Add color or contrast paint or tape to grab bars and handrails in your home. Place contrasting color strips on first and last steps.  Learn and use mobility aids as needed. Install an electrical emergency response system.  Turn on lights to avoid dark areas. Replace light  bulbs that burn out immediately. Get light switches that glow.  Arrange furniture to create clear pathways. Keep furniture in the same place.  Firmly attach carpet with non-skid or double-sided tape.  Eliminate uneven floor surfaces.  Select a carpet pattern that does not visually hide the edge of steps.  Be aware of all pets. OTHER HOME SAFETY TIPS  Set the water temperature for 120 F (48.8 C).  Keep emergency numbers on or near the telephone.  Keep smoke detectors on every level of the home and near sleeping  areas. Document Released: 03/22/2002 Document Revised: 10/01/2011 Document Reviewed: 06/21/2011 [facility] Patient Information 2015 [place], [place]. This information is not intended to replace advice given to you by your health care provider. Make sure you discuss any questions you have with your health care provider.

## 2014-12-29 NOTE — ED Notes (Signed)
Dr. Rancour at bedside. 

## 2015-03-03 ENCOUNTER — Emergency Department (HOSPITAL_COMMUNITY): Payer: PPO

## 2015-03-03 ENCOUNTER — Emergency Department (HOSPITAL_COMMUNITY)
Admission: EM | Admit: 2015-03-03 | Discharge: 2015-03-03 | Disposition: A | Discharge status: Home / Self Care | Payer: PPO | Attending: Emergency Medicine | Admitting: Emergency Medicine

## 2015-03-03 ENCOUNTER — Encounter (HOSPITAL_COMMUNITY): Payer: Self-pay

## 2015-03-03 DIAGNOSIS — Z8719 Personal history of other diseases of the digestive system: Secondary | ICD-10-CM | POA: Insufficient documentation

## 2015-03-03 DIAGNOSIS — S6990XA Unspecified injury of unspecified wrist, hand and finger(s), initial encounter: Secondary | ICD-10-CM | POA: Diagnosis not present

## 2015-03-03 DIAGNOSIS — W01198A Fall on same level from slipping, tripping and stumbling with subsequent striking against other object, initial encounter: Secondary | ICD-10-CM | POA: Insufficient documentation

## 2015-03-03 DIAGNOSIS — Z87891 Personal history of nicotine dependence: Secondary | ICD-10-CM | POA: Diagnosis not present

## 2015-03-03 DIAGNOSIS — S0990XA Unspecified injury of head, initial encounter: Secondary | ICD-10-CM | POA: Diagnosis not present

## 2015-03-03 DIAGNOSIS — W19XXXA Unspecified fall, initial encounter: Secondary | ICD-10-CM

## 2015-03-03 DIAGNOSIS — Y9389 Activity, other specified: Secondary | ICD-10-CM | POA: Insufficient documentation

## 2015-03-03 DIAGNOSIS — Z8669 Personal history of other diseases of the nervous system and sense organs: Secondary | ICD-10-CM | POA: Diagnosis not present

## 2015-03-03 DIAGNOSIS — Y9289 Other specified places as the place of occurrence of the external cause: Secondary | ICD-10-CM | POA: Diagnosis not present

## 2015-03-03 DIAGNOSIS — F0390 Unspecified dementia without behavioral disturbance: Secondary | ICD-10-CM | POA: Diagnosis not present

## 2015-03-03 DIAGNOSIS — Y998 Other external cause status: Secondary | ICD-10-CM | POA: Diagnosis not present

## 2015-03-03 DIAGNOSIS — I10 Essential (primary) hypertension: Secondary | ICD-10-CM | POA: Diagnosis not present

## 2015-03-03 MED ORDER — ACETAMINOPHEN 325 MG PO TABS
650.0000 mg | ORAL_TABLET | Freq: Once | ORAL | Status: AC
  Administered 2015-03-03: 650 mg via ORAL
  Filled 2015-03-03: qty 2

## 2015-03-03 NOTE — Discharge Instructions (Signed)
As discussed, your evaluation today has been largely reassuring.  But, it is important that you monitor your condition carefully, and do not hesitate to return to the ED if you develop new, or concerning changes in your condition. ? ?Otherwise, please follow-up with your physician for appropriate ongoing care. ? ?

## 2015-03-03 NOTE — ED Provider Notes (Signed)
CSN: [id number]     Arrival date & time 03/03/15  [id number] History  By signing my name below, I, Shelly Hurst, attest that this documentation has been prepared under the direction and in the presence of Shelly Muskrat, MD.  Electronically Signed: Tula Hurst, ED Scribe. 03/03/2015. 9:19 AM.   Chief Complaint  Patient presents with  . Fall   The history is provided by the patient. No language interpreter was used.    HPI Comments: Shelly Hurst is a 81 y.o. female, accompanied by her daughter, who presents to the Emergency Department after a witnessed fall at [facility]. Pt reports HA and finger pain as associated symptoms. Her daughter also notes mild increased difficulty with speech in the last few days. Per pt's daughter, she was ambulating with a walker when she fell to the side and hit her head on the wall. She was assisted by nursing home staff and did not fall to the floor. She denies LOC. Pt has a history of recurrent falls in the last year. Her daughter denies any recent medical problems or medication changes. Pt takes Aspirin-80 mg daily. She denies unilateral weakness, difficulty thinking or speaking, visual disturbances, fever, chills and vomiting.  Past Medical History  Diagnosis Date  . Hypertension   . Peripheral neuropathy ([place])   . Dementia   . IBS (irritable bowel syndrome)   . Diverticulosis of sigmoid colon   . Hyperplastic polyps of stomach    Past Surgical History  Procedure Laterality Date  . Hemorrhoid surgery      at age 21  . Cholecystectomy    . Appendectomy    . Rotator cuff repair      right  . Abdominal hysterectomy    . Ankle surgery     Family History  Problem Relation Age of Onset  . Cancer    . Kidney failure     Social History  Substance Use Topics  . Smoking status: Former Smoker -- 0.25 packs/day for 30 years    Start date: 04/15/1946    Quit date: 04/15/1988  . Smokeless tobacco: Never Used  . Alcohol Use: Yes    OB History    No data available     Review of Systems  Constitutional: Negative for fever and chills.       Per HPI, otherwise negative  HENT:       Per HPI, otherwise negative  Eyes: Negative for visual disturbance.  Respiratory:       Per HPI, otherwise negative  Cardiovascular:       Per HPI, otherwise negative  Gastrointestinal: Negative for vomiting.  Endocrine:       Negative aside from HPI  Genitourinary:       Neg aside from HPI   Musculoskeletal: Positive for arthralgias.       Per HPI, otherwise negative  Skin: Negative.   Neurological: Positive for headaches. Negative for syncope, speech difficulty and weakness.  Psychiatric/Behavioral: Negative for decreased concentration.  All other systems reviewed and are negative.  Allergies  Codeine  Home Medications   Prior to Admission medications   Medication Sig Start Date End Date Taking? Authorizing Provider  acetaminophen (TYLENOL) 325 MG tablet Take 650 mg by mouth every 6 (six) hours as needed for moderate pain.     Historical Provider, MD  acetaminophen (TYLENOL) 650 MG CR tablet Take 1,300 mg by mouth 2 (two) times daily.     Historical Provider, MD  aspirin EC 81  MG tablet Take 81 mg by mouth daily.    Historical Provider, MD  calcium-vitamin D (OSCAL WITH D) 500-200 MG-UNIT per tablet Take 1 tablet by mouth 2 (two) times daily.    Historical Provider, MD  donepezil (ARICEPT) 10 MG tablet Take 10 mg by mouth daily.    Historical Provider, MD  donepezil (ARICEPT) 5 MG tablet Take 5 mg by mouth daily.    Historical Provider, MD  lisinopril (PRINIVIL,ZESTRIL) 20 MG tablet Take 20 mg by mouth daily.    Historical Provider, MD  magnesium hydroxide (MILK OF MAGNESIA) 400 MG/5ML suspension Take 30 mLs by mouth every 12 (twelve) hours as needed for mild constipation.    Historical Provider, MD  memantine (NAMENDA) 10 MG tablet Take 10 mg by mouth every evening.    Historical Provider, MD  mirtazapine (REMERON) 15 MG  tablet Take 7.5 mg by mouth at bedtime.    Historical Provider, MD  Multiple Vitamin (MULTIVITAMIN WITH MINERALS) TABS tablet Take 1 tablet by mouth daily.    Historical Provider, MD  senna-docusate (SENNA S) 8.6-50 MG per tablet Take 1 tablet by mouth daily.    Historical Provider, MD  solifenacin (VESICARE) 10 MG tablet Take 10 mg by mouth daily.    Historical Provider, MD   BP 139/54 mmHg  Pulse 56  Temp(Src) 98.1 F (36.7 C) (Oral)  Resp 14  Ht 5\' 1"  (1.549 m)  Wt 130 lb (58.968 kg)  BMI 24.58 kg/m2  SpO2 95% Physical Exam  Constitutional: She appears well-developed and well-nourished. No distress.  HENT:  Head: Normocephalic and atraumatic.  Head: No TTP No deformities Mild erythema to anterior scalp  Eyes: Conjunctivae and EOM are normal.  Neck: Normal range of motion.  No TTP Mild pain with ROM  Cardiovascular: Normal rate and regular rhythm.   Pulmonary/Chest: Effort normal and breath sounds normal. No stridor. No respiratory distress.  Abdominal: She exhibits no distension.  Musculoskeletal: She exhibits no edema.  Moves all extremities well  Neurological: She is alert. No cranial nerve deficit. She exhibits normal muscle tone.  Skin: Skin is warm and dry.  Psychiatric: She has a normal mood and affect.  Nursing note and vitals reviewed.   ED Course  Procedures  DIAGNOSTIC STUDIES: Oxygen Saturation is 100% on RA, normal by my interpretation.    COORDINATION OF CARE: 9:19 AM Discussed treatment plan with pt which includes CT Head and cervical spine. Pt and daughter agreed to plan.  Labs Review Labs Reviewed - No data to display  Imaging Review Ct Head Wo Contrast  03/03/2015  CLINICAL DATA:  Status post fall today with a blow to the head. Dementia. Headache. Initial encounter. EXAM: CT HEAD WITHOUT CONTRAST CT CERVICAL SPINE WITHOUT CONTRAST TECHNIQUE: Multidetector CT imaging of the head and cervical spine was performed following the standard protocol  without intravenous contrast. Multiplanar CT image reconstructions of the cervical spine were also generated. COMPARISON:  Head and cervical spine CT scans 12/29/2014 and 11/28/2014 FINDINGS: CT HEAD FINDINGS No evidence of acute intracranial abnormality including hemorrhage, infarct, mass lesion, mass effect, midline shift or abnormal extra-axial fluid collection is identified. No hydrocephalus or pneumocephalus. The calvarium is intact. Imaged paranasal sinuses and mastoid air cells are clear. CT CERVICAL SPINE FINDINGS There is no fracture. Trace anterolisthesis C7 on T1 due to facet arthropathy is noted. Alignment is otherwise normal. Loss of disc space height and endplate spurring are worst at C5-6. There is autologous fusion across the C2-3 level. Lung  apices are clear. IMPRESSION: No acute abnormality head or cervical spine. Cortical atrophy. Cervical spondylosis. Electronically Signed   By: Inge Rise M.D.   On: 03/03/2015 10:48   Ct Cervical Spine Wo Contrast  03/03/2015  CLINICAL DATA:  Status post fall today with a blow to the head. Dementia. Headache. Initial encounter. EXAM: CT HEAD WITHOUT CONTRAST CT CERVICAL SPINE WITHOUT CONTRAST TECHNIQUE: Multidetector CT imaging of the head and cervical spine was performed following the standard protocol without intravenous contrast. Multiplanar CT image reconstructions of the cervical spine were also generated. COMPARISON:  Head and cervical spine CT scans 12/29/2014 and 11/28/2014 FINDINGS: CT HEAD FINDINGS No evidence of acute intracranial abnormality including hemorrhage, infarct, mass lesion, mass effect, midline shift or abnormal extra-axial fluid collection is identified. No hydrocephalus or pneumocephalus. The calvarium is intact. Imaged paranasal sinuses and mastoid air cells are clear. CT CERVICAL SPINE FINDINGS There is no fracture. Trace anterolisthesis C7 on T1 due to facet arthropathy is noted. Alignment is otherwise normal. Loss of disc  space height and endplate spurring are worst at C5-6. There is autologous fusion across the C2-3 level. Lung apices are clear. IMPRESSION: No acute abnormality head or cervical spine. Cortical atrophy. Cervical spondylosis. Electronically Signed   By: Inge Rise M.D.   On: 03/03/2015 10:48   I have personally reviewed and evaluated these images as part of my medical decision-making.   EKG Interpretation   Date/Time:  Friday March 03 2015 09:01:40 EST Ventricular Rate:  51 PR Interval:  190 QRS Duration: 81 QT Interval:  481 QTC Calculation: 443 R Axis:   -42 Text Interpretation:  Sinus rhythm Left axis deviation ST elevation,  consider anterior injury Sinus rhythm Left axis deviation ST-t wave  abnormality Abnormal ekg Confirmed by Shelly Muskrat  MD ([id number]) on  03/03/2015 10:19:27 AM     On repeat exam the patient is in no distress. MDM   I personally performed the services described in this documentation, which was scribed in my presence. The recorded information has been reviewed and is accurate.    Patient presents with concern of fall. Here, the patient is awake, alert, interacting in a typical manner, according to family members. Radiographic studies were unremarkable.  Patient discharged in stable condition back to her nursing facility.   Shelly Muskrat, MD 03/03/15 1106

## 2015-03-03 NOTE — ED Notes (Signed)
Pt resident of [facility].  This morning pt was in the hall walking with walker.  Staff saw pt "freeze" and fall against the wall, hitting head on wall.  Staff grabbed pt and lowered her to the floor.  Pt did not lose consciousness per staff.  Pt c/o headache.

## 2015-03-03 NOTE — ED Notes (Signed)
Stood patient to side of bed with assistance. Heart rate increased to 66 bpm.

## 2015-03-03 NOTE — ED Notes (Signed)
Patient complaining of pain. Family member requesting tylenol for pain.

## 2015-04-20 DIAGNOSIS — S90412A Abrasion, left great toe, initial encounter: Secondary | ICD-10-CM | POA: Diagnosis not present

## 2015-05-13 DIAGNOSIS — I10 Essential (primary) hypertension: Secondary | ICD-10-CM | POA: Diagnosis not present

## 2015-05-13 DIAGNOSIS — M150 Primary generalized (osteo)arthritis: Secondary | ICD-10-CM | POA: Diagnosis not present

## 2015-05-13 DIAGNOSIS — R270 Ataxia, unspecified: Secondary | ICD-10-CM | POA: Diagnosis not present

## 2015-06-14 DIAGNOSIS — B351 Tinea unguium: Secondary | ICD-10-CM | POA: Diagnosis not present

## 2015-06-14 DIAGNOSIS — M79675 Pain in left toe(s): Secondary | ICD-10-CM | POA: Diagnosis not present

## 2015-06-23 ENCOUNTER — Encounter (HOSPITAL_COMMUNITY): Payer: Self-pay | Admitting: Emergency Medicine

## 2015-06-23 ENCOUNTER — Emergency Department (HOSPITAL_COMMUNITY)
Admission: EM | Admit: 2015-06-23 | Discharge: 2015-06-23 | Disposition: A | Discharge status: Home / Self Care | Payer: PPO | Attending: Emergency Medicine | Admitting: Emergency Medicine

## 2015-06-23 DIAGNOSIS — Z79899 Other long term (current) drug therapy: Secondary | ICD-10-CM | POA: Diagnosis not present

## 2015-06-23 DIAGNOSIS — Z87891 Personal history of nicotine dependence: Secondary | ICD-10-CM | POA: Insufficient documentation

## 2015-06-23 DIAGNOSIS — I10 Essential (primary) hypertension: Secondary | ICD-10-CM | POA: Diagnosis not present

## 2015-06-23 DIAGNOSIS — R040 Epistaxis: Secondary | ICD-10-CM | POA: Diagnosis not present

## 2015-06-23 DIAGNOSIS — F0390 Unspecified dementia without behavioral disturbance: Secondary | ICD-10-CM | POA: Diagnosis not present

## 2015-06-23 DIAGNOSIS — G629 Polyneuropathy, unspecified: Secondary | ICD-10-CM | POA: Diagnosis not present

## 2015-06-23 DIAGNOSIS — Z7982 Long term (current) use of aspirin: Secondary | ICD-10-CM | POA: Insufficient documentation

## 2015-06-23 LAB — CBC WITH DIFFERENTIAL/PLATELET
BASOS ABS: 0 10*3/uL (ref 0.0–0.1)
BASOS PCT: 0 %
EOS PCT: 1 %
Eosinophils Absolute: 0.1 10*3/uL (ref 0.0–0.7)
HCT: 39.1 % (ref 36.0–46.0)
Hemoglobin: 13.1 g/dL (ref 12.0–15.0)
Lymphocytes Relative: 19 %
Lymphs Abs: 2.3 10*3/uL (ref 0.7–4.0)
MCH: 32.8 pg (ref 26.0–34.0)
MCHC: 33.5 g/dL (ref 30.0–36.0)
MCV: 98 fL (ref 78.0–100.0)
MONO ABS: 0.5 10*3/uL (ref 0.1–1.0)
Monocytes Relative: 4 %
Neutro Abs: 9.5 10*3/uL — ABNORMAL HIGH (ref 1.7–7.7)
Neutrophils Relative %: 76 %
PLATELETS: 248 10*3/uL (ref 150–400)
RBC: 3.99 MIL/uL (ref 3.87–5.11)
RDW: 13.8 % (ref 11.5–15.5)
WBC: 12.5 10*3/uL — ABNORMAL HIGH (ref 4.0–10.5)

## 2015-06-23 MED ORDER — OXYMETAZOLINE HCL 0.05 % NA SOLN
1.0000 | Freq: Once | NASAL | Status: AC
  Administered 2015-06-23: 1 via NASAL

## 2015-06-23 MED ORDER — OXYMETAZOLINE HCL 0.05 % NA SOLN
NASAL | Status: AC
  Administered 2015-06-23: 1 via NASAL
  Filled 2015-06-23: qty 15

## 2015-06-23 NOTE — Discharge Instructions (Signed)

## 2015-06-23 NOTE — ED Notes (Signed)
PT family reports nose bleed this am around 0800 and stopped around 0900. Then started back around 1000 reported by ALF. PT had hx of nosebleeds but none since 2014. PT on aspirin 81mg  only. Nose bleeding at this time in triage.

## 2015-06-23 NOTE — ED Notes (Signed)
Hygiene care provided- washed face and hands with washcloth.

## 2015-06-23 NOTE — ED Provider Notes (Signed)
CSN: [id number]     Arrival date & time 06/23/15  1144 History   First MD Initiated Contact with Patient 06/23/15 1306     Chief Complaint  Patient presents with  . Epistaxis      HPI Level 5 caveat due to dementia. Patient presented with nosebleed. Reportedly started at 8 AM this morning then controlled at 9am . Started back up. Presents to the ER with left-sided bleeding. Patient does have a history of dementia. No reported trauma. Has had previous nosebleeds that required packing and ENT. Reportedly had one episode of this in the past. She is not on anticoagulation except for baby aspirin.   Past Medical History  Diagnosis Date  . Hypertension   . Peripheral neuropathy ([place])   . Dementia   . IBS (irritable bowel syndrome)   . Diverticulosis of sigmoid colon   . Hyperplastic polyps of stomach    Past Surgical History  Procedure Laterality Date  . Hemorrhoid surgery      at age 17  . Cholecystectomy    . Appendectomy    . Rotator cuff repair      right  . Abdominal hysterectomy    . Ankle surgery     Family History  Problem Relation Age of Onset  . Cancer    . Kidney failure     Social History  Substance Use Topics  . Smoking status: Former Smoker -- 0.25 packs/day for 30 years    Start date: 04/15/1946    Quit date: 04/15/1988  . Smokeless tobacco: Never Used  . Alcohol Use: No   OB History    No data available     Review of Systems  Unable to perform ROS: Dementia      Allergies  Codeine  Home Medications   Prior to Admission medications   Medication Sig Start Date End Date Taking? Authorizing Provider  acetaminophen (TYLENOL) 325 MG tablet Take 650 mg by mouth every 6 (six) hours as needed for moderate pain.    Yes Historical Provider, MD  acetaminophen (TYLENOL) 650 MG CR tablet Take 1,300 mg by mouth 2 (two) times daily.    Yes Historical Provider, MD  aspirin EC 81 MG tablet Take 81 mg by mouth daily.   Yes Historical Provider, MD   calcium-vitamin D (OSCAL WITH D) 500-200 MG-UNIT per tablet Take 1 tablet by mouth 2 (two) times daily.   Yes Historical Provider, MD  donepezil (ARICEPT) 10 MG tablet Take 10 mg by mouth daily.   Yes Historical Provider, MD  lisinopril (PRINIVIL,ZESTRIL) 20 MG tablet Take 20 mg by mouth daily.   Yes Historical Provider, MD  magnesium hydroxide (MILK OF MAGNESIA) 400 MG/5ML suspension Take 30 mLs by mouth every 12 (twelve) hours as needed for mild constipation.   Yes Historical Provider, MD  memantine (NAMENDA) 10 MG tablet Take 10 mg by mouth every evening.   Yes Historical Provider, MD  mirtazapine (REMERON) 15 MG tablet Take 7.5 mg by mouth at bedtime.   Yes Historical Provider, MD  Multiple Vitamin (MULTIVITAMIN WITH MINERALS) TABS tablet Take 1 tablet by mouth daily.   Yes Historical Provider, MD  polyethylene glycol (MIRALAX / GLYCOLAX) packet Take 17 g by mouth daily.   Yes Historical Provider, MD  senna-docusate (SENNA S) 8.6-50 MG per tablet Take 1 tablet by mouth daily.   Yes Historical Provider, MD  solifenacin (VESICARE) 10 MG tablet Take 10 mg by mouth daily.   Yes Historical Provider, MD  BP 148/52 mmHg  Pulse 63  Temp(Src) 98.2 F (36.8 C) (Oral)  Resp 14  Ht 5\' 1"  (1.549 m)  Wt 134 lb (60.782 kg)  BMI 25.33 kg/m2  SpO2 99% Physical Exam  Constitutional: She appears well-developed.  HENT:  Head: Atraumatic.  Active anterior nosebleed and left nostril.  Neck: Neck supple.  Cardiovascular: Normal rate.   Pulmonary/Chest: Effort normal.  Abdominal: Soft. There is no tenderness.  Neurological: She is alert.  Awake and pleasant, but demented.  Skin:  Dried blood on hands and her clothes.    ED Course  .Epistaxis Management Date/Time: 06/23/2015 1:55 PM Performed by: Davonna Belling Authorized by: Davonna Belling Consent: Verbal consent obtained. Written consent not obtained. Risks and benefits: risks, benefits and alternatives were discussed Consent given by:  daughter. Patient sedated: no Treatment site: left anterior Repair method: nasal balloon Post-procedure assessment: bleeding stopped Treatment complexity: simple Patient tolerance: Patient tolerated the procedure well with no immediate complications   (including critical care time) Labs Review Labs Reviewed  CBC WITH DIFFERENTIAL/PLATELET - Abnormal; Notable for the following:    WBC 12.5 (*)    Neutro Abs 9.5 (*)    All other components within normal limits    Imaging Review No results found. I have personally reviewed and evaluated these images and lab results as part of my medical decision-making.   EKG Interpretation None      MDM   Final diagnoses:  Anterior epistaxis    Patient with epistaxis. Bleeding controlled with anterior balloon. Will discharge. Patient's daughter will be transporting her to have the packing removed in 2 days. She will return to the ER since she cannot follow-up on Monday with ENT.    Davonna Belling, MD 06/23/15 1355

## 2015-07-17 DIAGNOSIS — R040 Epistaxis: Secondary | ICD-10-CM | POA: Diagnosis not present

## 2015-08-17 ENCOUNTER — Ambulatory Visit (INDEPENDENT_AMBULATORY_CARE_PROVIDER_SITE_OTHER): Payer: PPO | Admitting: Otolaryngology

## 2015-08-17 DIAGNOSIS — R040 Epistaxis: Secondary | ICD-10-CM | POA: Diagnosis not present

## 2015-08-29 DIAGNOSIS — L03312 Cellulitis of back [any part except buttock]: Secondary | ICD-10-CM | POA: Diagnosis not present

## 2015-08-29 DIAGNOSIS — M79675 Pain in left toe(s): Secondary | ICD-10-CM | POA: Diagnosis not present

## 2015-08-30 DIAGNOSIS — M79675 Pain in left toe(s): Secondary | ICD-10-CM | POA: Diagnosis not present

## 2015-08-30 DIAGNOSIS — B351 Tinea unguium: Secondary | ICD-10-CM | POA: Diagnosis not present

## 2015-09-07 DIAGNOSIS — M79672 Pain in left foot: Secondary | ICD-10-CM | POA: Diagnosis not present

## 2015-09-07 DIAGNOSIS — B351 Tinea unguium: Secondary | ICD-10-CM | POA: Diagnosis not present

## 2015-09-07 DIAGNOSIS — M79671 Pain in right foot: Secondary | ICD-10-CM | POA: Diagnosis not present

## 2015-11-02 DIAGNOSIS — N390 Urinary tract infection, site not specified: Secondary | ICD-10-CM | POA: Diagnosis not present

## 2015-11-22 DIAGNOSIS — M79675 Pain in left toe(s): Secondary | ICD-10-CM | POA: Diagnosis not present

## 2015-11-22 DIAGNOSIS — B351 Tinea unguium: Secondary | ICD-10-CM | POA: Diagnosis not present

## 2015-12-21 DIAGNOSIS — R350 Frequency of micturition: Secondary | ICD-10-CM | POA: Diagnosis not present

## 2015-12-21 DIAGNOSIS — Z6824 Body mass index (BMI) 24.0-24.9, adult: Secondary | ICD-10-CM | POA: Diagnosis not present

## 2016-02-06 DIAGNOSIS — M79675 Pain in left toe(s): Secondary | ICD-10-CM | POA: Diagnosis not present

## 2016-02-06 DIAGNOSIS — B351 Tinea unguium: Secondary | ICD-10-CM | POA: Diagnosis not present

## 2016-03-11 DIAGNOSIS — Z6824 Body mass index (BMI) 24.0-24.9, adult: Secondary | ICD-10-CM | POA: Diagnosis not present

## 2016-03-11 DIAGNOSIS — R600 Localized edema: Secondary | ICD-10-CM | POA: Diagnosis not present

## 2016-04-23 DIAGNOSIS — M79674 Pain in right toe(s): Secondary | ICD-10-CM | POA: Diagnosis not present

## 2016-04-23 DIAGNOSIS — M79675 Pain in left toe(s): Secondary | ICD-10-CM | POA: Diagnosis not present

## 2016-04-23 DIAGNOSIS — B351 Tinea unguium: Secondary | ICD-10-CM | POA: Diagnosis not present

## 2016-07-01 DIAGNOSIS — Z6824 Body mass index (BMI) 24.0-24.9, adult: Secondary | ICD-10-CM | POA: Diagnosis not present

## 2016-07-01 DIAGNOSIS — I10 Essential (primary) hypertension: Secondary | ICD-10-CM | POA: Diagnosis not present

## 2016-07-01 DIAGNOSIS — G309 Alzheimer's disease, unspecified: Secondary | ICD-10-CM | POA: Diagnosis not present

## 2016-07-01 DIAGNOSIS — Z111 Encounter for screening for respiratory tuberculosis: Secondary | ICD-10-CM | POA: Diagnosis not present

## 2016-07-01 DIAGNOSIS — N393 Stress incontinence (female) (male): Secondary | ICD-10-CM | POA: Diagnosis not present

## 2016-07-02 ENCOUNTER — Emergency Department (HOSPITAL_COMMUNITY)
Admission: EM | Admit: 2016-07-02 | Discharge: 2016-07-03 | Disposition: A | Discharge status: Home / Self Care | Payer: PPO | Attending: Emergency Medicine | Admitting: Emergency Medicine

## 2016-07-02 ENCOUNTER — Emergency Department (HOSPITAL_COMMUNITY): Payer: PPO

## 2016-07-02 ENCOUNTER — Encounter (HOSPITAL_COMMUNITY): Payer: Self-pay | Admitting: Emergency Medicine

## 2016-07-02 DIAGNOSIS — S50312A Abrasion of left elbow, initial encounter: Secondary | ICD-10-CM | POA: Diagnosis not present

## 2016-07-02 DIAGNOSIS — Y999 Unspecified external cause status: Secondary | ICD-10-CM | POA: Diagnosis not present

## 2016-07-02 DIAGNOSIS — Y939 Activity, unspecified: Secondary | ICD-10-CM | POA: Insufficient documentation

## 2016-07-02 DIAGNOSIS — Z7982 Long term (current) use of aspirin: Secondary | ICD-10-CM | POA: Diagnosis not present

## 2016-07-02 DIAGNOSIS — Z87891 Personal history of nicotine dependence: Secondary | ICD-10-CM | POA: Diagnosis not present

## 2016-07-02 DIAGNOSIS — W19XXXA Unspecified fall, initial encounter: Secondary | ICD-10-CM | POA: Diagnosis not present

## 2016-07-02 DIAGNOSIS — I10 Essential (primary) hypertension: Secondary | ICD-10-CM | POA: Diagnosis not present

## 2016-07-02 DIAGNOSIS — Y92129 Unspecified place in nursing home as the place of occurrence of the external cause: Secondary | ICD-10-CM | POA: Insufficient documentation

## 2016-07-02 DIAGNOSIS — R05 Cough: Secondary | ICD-10-CM | POA: Diagnosis not present

## 2016-07-02 DIAGNOSIS — J40 Bronchitis, not specified as acute or chronic: Secondary | ICD-10-CM | POA: Insufficient documentation

## 2016-07-02 DIAGNOSIS — S0001XA Abrasion of scalp, initial encounter: Secondary | ICD-10-CM | POA: Diagnosis not present

## 2016-07-02 DIAGNOSIS — S0101XA Laceration without foreign body of scalp, initial encounter: Secondary | ICD-10-CM | POA: Diagnosis not present

## 2016-07-02 DIAGNOSIS — S0990XA Unspecified injury of head, initial encounter: Secondary | ICD-10-CM | POA: Diagnosis not present

## 2016-07-02 NOTE — ED Triage Notes (Signed)
From [facility], pt was found in the floor in bathroom.  Skin tear lt arm and lac to back of head with no active bleeding.

## 2016-07-02 NOTE — ED Provider Notes (Signed)
[facility] DEPT Provider Note   CSN: [id number] Arrival date & time: 07/02/16  2250  By signing my name below, I, Oleh Genin, attest that this documentation has been prepared under the direction and in the presence of Rolland Porter, MD. Electronically Signed: Oleh Genin, Scribe. 07/02/16. 11:52 PM.  Time seen 23:40 PM   History   Chief Complaint Chief Complaint  Patient presents with  . Fall   Level V caveat for dementia  HPI MONCERRATH BERHE is a 83 y.o. female with history of Altzheimer's dementia, HTN, and IBS who presents to the ED following an unwitnessed fall from [facility] skilled [facility]. According to nursing staff, this patient was found down in her bathroom floor after an apparent fall. She had a laceration to the back of her head as well as a skin tear to the L elbow. The patient's daughter is at interview who assists with history. She states that incidentally the patient has also developed a cough  yesterday. She was seen at routine primary care office visit yesterday who attributed her cough to "aspiration cough". No documented fever. No difficulty swallowing, although daughter notes sometimes she coughs when she drinks liquids (daughter is a Astronomer). She ambulates with a walker at baseline. The patient arrives with a signed DNR.   The history is provided by the patient and a relative. No language interpreter was used.  Fall  This is a new problem. The current episode started 1 to 2 hours ago. Associated symptoms comments: Head laceration. Also fever, cough per daughter.Marland Kitchen   PCP Rory Percy, MD  Patient is DO NOT RESUSCITATE  Past Medical History:  Diagnosis Date  . Dementia   . Diverticulosis of sigmoid colon   . Hyperplastic polyps of stomach   . Hypertension   . IBS (irritable bowel syndrome)   . Peripheral neuropathy [facility])     Patient Active Problem List   Diagnosis Date Noted  . FECAL URGENCY 08/17/2009  . COLONIC POLYPS, ADENOMATOUS  06/14/2009  . ABDOMINAL PAIN, LOWER 06/14/2009  . NEUROPATHY 06/13/2009  . CONSTIPATION, INTERMITTENT 06/13/2009  . IRRITABLE BOWEL SYNDROME 06/13/2009  . DIARRHEA 06/13/2009  . HX OF GALLSTONE 06/13/2009  . HYPERTENSION, UNSPECIFIED 11/28/2008  . CHEST PAIN-UNSPECIFIED 11/28/2008    Past Surgical History:  Procedure Laterality Date  . ABDOMINAL HYSTERECTOMY    . ANKLE SURGERY    . APPENDECTOMY    . CHOLECYSTECTOMY    . HEMORRHOID SURGERY     at age 27  . ROTATOR CUFF REPAIR     right    OB History    No data available       Home Medications    Prior to Admission medications   Medication Sig Start Date End Date Taking? Authorizing Provider  acetaminophen (TYLENOL) 325 MG tablet Take 650 mg by mouth every 6 (six) hours as needed for moderate pain.     Historical Provider, MD  acetaminophen (TYLENOL) 650 MG CR tablet Take 1,300 mg by mouth 2 (two) times daily.     Historical Provider, MD  aspirin EC 81 MG tablet Take 81 mg by mouth daily.    Historical Provider, MD  azithromycin (ZITHROMAX Z-PAK) 250 MG tablet Take 2 po the first day then once a day for the next 4 days. 07/03/16   Rolland Porter, MD  calcium-vitamin D (OSCAL WITH D) 500-200 MG-UNIT per tablet Take 1 tablet by mouth 2 (two) times daily.    Historical Provider, MD  donepezil (ARICEPT) 10 MG  tablet Take 10 mg by mouth daily.    Historical Provider, MD  lisinopril (PRINIVIL,ZESTRIL) 20 MG tablet Take 20 mg by mouth daily.    Historical Provider, MD  magnesium hydroxide (MILK OF MAGNESIA) 400 MG/5ML suspension Take 30 mLs by mouth every 12 (twelve) hours as needed for mild constipation.    Historical Provider, MD  memantine (NAMENDA) 10 MG tablet Take 10 mg by mouth every evening.    Historical Provider, MD  mirtazapine (REMERON) 15 MG tablet Take 7.5 mg by mouth at bedtime.    Historical Provider, MD  Multiple Vitamin (MULTIVITAMIN WITH MINERALS) TABS tablet Take 1 tablet by mouth daily.    Historical Provider, MD    polyethylene glycol (MIRALAX / GLYCOLAX) packet Take 17 g by mouth daily.    Historical Provider, MD  senna-docusate (SENNA S) 8.6-50 MG per tablet Take 1 tablet by mouth daily.    Historical Provider, MD  solifenacin (VESICARE) 10 MG tablet Take 10 mg by mouth daily.    Historical Provider, MD    Family History Family History  Problem Relation Age of Onset  . Cancer    . Kidney failure      Social History Social History  Substance Use Topics  . Smoking status: Former Smoker    Packs/day: 0.25    Years: 30.00    Start date: 04/15/1946    Quit date: 04/15/1988  . Smokeless tobacco: Never Used  . Alcohol use No  lives in NH Uses a walker   Allergies   Codeine   Review of Systems Review of Systems  Constitutional: Positive for fever (low grade).  Respiratory: Positive for cough.   Neurological:       Fall with head injury.  All other systems reviewed and are negative.    Physical Exam Updated Vital Signs BP (!) 136/94 (BP Location: Left Arm)   Pulse 63   Temp 99.3 F (37.4 C) (Oral)   Resp 18   Ht 5\' 2"  (1.575 m)   Wt 130 lb (59 kg)   SpO2 98%   BMI 23.78 kg/m   Vital signs normal except for low grade fever   Physical Exam  Constitutional: She is oriented to person, place, and time. She appears well-developed and well-nourished.  Non-toxic appearance. She does not appear ill. No distress.  HENT:  Head: Normocephalic.  Right Ear: External ear normal.  Left Ear: External ear normal.  Nose: Nose normal. No mucosal edema or rhinorrhea.  Mouth/Throat: Oropharynx is clear and moist and mucous membranes are normal. No dental abscesses or uvula swelling.  There is dried blood over the posterior scalp. After it was clean she has a 2 mm laceration/abrasion on her posterior scalp.   Eyes: Conjunctivae and EOM are normal. Pupils are equal, round, and reactive to light.  Neck: Normal range of motion and full passive range of motion without pain. Neck supple.   Cardiovascular: Normal rate, regular rhythm and normal heart sounds.  Exam reveals no gallop and no friction rub.   No murmur heard. Pulmonary/Chest: Effort normal and breath sounds normal. No respiratory distress. She has no wheezes. She has no rhonchi. She has no rales. She exhibits no tenderness and no crepitus.  coughing during her interview and exam  Abdominal: Soft. Normal appearance and bowel sounds are normal. She exhibits no distension. There is no tenderness. There is no rebound and no guarding.  Musculoskeletal: Normal range of motion. She exhibits no edema or tenderness.  There is a small,  0.5 cm abrasion to the L elbow. Able to fully range the L elbow. Moves all extremities well.   Neurological: She is alert and oriented to person, place, and time. She has normal strength. No cranial nerve deficit.  Skin: Skin is warm, dry and intact. No rash noted. No erythema. No pallor.  Psychiatric: She has a normal mood and affect. Her speech is normal and behavior is normal. Her mood appears not anxious.  Nursing note and vitals reviewed.    ED Treatments / Results  DIAGNOSTIC STUDIES: Oxygen Saturation is 98 percent on room air which is normal by my interpretation.     Labs (all labs ordered are listed, but only abnormal results are displayed) Results for orders placed or performed during the hospital encounter of 07/02/16  Culture, blood (routine x 2)  Result Value Ref Range   Specimen Description BLOOD RIGHT ARM    Special Requests BOTTLES DRAWN AEROBIC ONLY [place]    Culture PENDING    Report Status PENDING   Culture, blood (routine x 2)  Result Value Ref Range   Specimen Description BLOOD RIGHT HAND    Special Requests BOTTLES DRAWN AEROBIC ONLY 6CC    Culture PENDING    Report Status PENDING   Comprehensive metabolic panel  Result Value Ref Range   Sodium 140 135 - 145 mmol/L   Potassium 3.9 3.5 - 5.1 mmol/L   Chloride 104 101 - 111 mmol/L   CO2 26 22 - 32 mmol/L    Glucose, Bld 90 65 - 99 mg/dL   BUN 22 (H) 6 - 20 mg/dL   Creatinine, Ser 0.77 0.44 - 1.00 mg/dL   Calcium 9.3 8.9 - 10.3 mg/dL   Total Protein 7.1 6.5 - 8.1 g/dL   Albumin 4.0 3.5 - 5.0 g/dL   AST 24 15 - 41 U/L   ALT 14 14 - 54 U/L   Alkaline Phosphatase 73 38 - 126 U/L   Total Bilirubin 0.6 0.3 - 1.2 mg/dL   GFR calc non Af Amer >60 >60 mL/min   GFR calc Af Amer >60 >60 mL/min   Anion gap 10 5 - 15  CBC with Differential  Result Value Ref Range   WBC 6.4 4.0 - 10.5 K/uL   RBC 4.03 3.87 - 5.11 MIL/uL   Hemoglobin 12.5 12.0 - 15.0 g/dL   HCT 38.8 36.0 - 46.0 %   MCV 96.3 78.0 - 100.0 fL   MCH 31.0 26.0 - 34.0 pg   MCHC 32.2 30.0 - 36.0 g/dL   RDW 15.5 11.5 - 15.5 %   Platelets 192 150 - 400 K/uL   Neutrophils Relative % 82 %   Neutro Abs 5.2 1.7 - 7.7 K/uL   Lymphocytes Relative 12 %   Lymphs Abs 0.8 0.7 - 4.0 K/uL   Monocytes Relative 6 %   Monocytes Absolute 0.4 0.1 - 1.0 K/uL   Eosinophils Relative 0 %   Eosinophils Absolute 0.0 0.0 - 0.7 K/uL   Basophils Relative 0 %   Basophils Absolute 0.0 0.0 - 0.1 K/uL   Laboratory interpretation all normal except mildly elevated BUN    EKG  EKG Interpretation None       Radiology Dg Chest 2 View  Result Date: 07/03/2016 CLINICAL DATA:  83 year old female with cough. EXAM: CHEST  2 VIEW COMPARISON:  Chest radiograph dated 03/17/2014 FINDINGS: The lungs are clear. There is no pleural effusion or pneumothorax. Top-normal cardiac size. Atherosclerotic calcification of the aortic arch. Osteopenia with  degenerative changes of the spine and shoulders. No acute fracture. IMPRESSION: No active cardiopulmonary disease. Electronically Signed   By: Anner Crete M.D.   On: 07/03/2016 01:18   Ct Head Wo Contrast  Result Date: 07/03/2016 CLINICAL DATA:  83 year old female with fall and trauma to back of head. EXAM: CT HEAD WITHOUT CONTRAST TECHNIQUE: Contiguous axial images were obtained from the base of the skull through the  vertex without intravenous contrast. COMPARISON:  Head CT dated 03/03/2015 FINDINGS: Evaluation of this exam is limited due to motion artifact. Brain: There is moderate age-related atrophy and chronic microvascular ischemic changes. There is no acute intracranial hemorrhage. No mass effect or midline shift noted. No extra-axial fluid collection. Vascular: No hyperdense vessel or unexpected calcification. Skull: Small scattered calvarial hypodense lesions noted similar to prior CT. No acute fracture. Sinuses/Orbits: There is mild mucoperiosteal thickening of paranasal sinuses. No air-fluid levels. Mastoid air cells are clear. Other: None IMPRESSION: 1. No acute intracranial hemorrhage. 2. Moderate age-related atrophy and chronic microvascular ischemic changes. 3. Multiple small calvarial hypodense lesions, indeterminate. These are similar to the study of 2016. Electronically Signed   By: Anner Crete M.D.   On: 07/03/2016 01:22    Procedures Procedures (including critical care time)  Medications Ordered in ED Medications  hydrogen peroxide 3 % external solution (not administered)     Initial Impression / Assessment and Plan / ED Course  I have reviewed the triage vital signs and the nursing notes.  Pertinent labs & imaging results that were available during my care of the patient were reviewed by me and considered in my medical decision making (see chart for details).   COORDINATION OF CARE: 11:50 PM Will obtain basic labwork as well as CT head, chest x-ray. The patient and her daughter are agreeable with this plan.  Her wounds were cleaned and dressed by nursing staff. Her scalp abrasion does not require suturing or stapling. After reviewing her chest x-ray patient was discharged home on a Z-Pak.She has dementia and is coughing and not covering her mouth. She is going back to a NH.    Final Clinical Impressions(s) / ED Diagnoses   Final diagnoses:  Fall at nursing home, initial  encounter  Abrasion of left elbow, initial encounter  Abrasion, scalp w/o infection  Bronchitis    New Prescriptions New Prescriptions   AZITHROMYCIN (ZITHROMAX Z-PAK) 250 MG TABLET    Take 2 po the first day then once a day for the next 4 days.    Plan discharge  Rolland Porter, MD, FACEP  I personally performed the services described in this documentation, which was scribed in my presence. The recorded information has been reviewed and considered.  Rolland Porter, MD, Barbette Or, MD 07/03/16 [phone]

## 2016-07-03 DIAGNOSIS — R05 Cough: Secondary | ICD-10-CM | POA: Diagnosis not present

## 2016-07-03 DIAGNOSIS — S0990XA Unspecified injury of head, initial encounter: Secondary | ICD-10-CM | POA: Diagnosis not present

## 2016-07-03 LAB — COMPREHENSIVE METABOLIC PANEL
ALBUMIN: 4 g/dL (ref 3.5–5.0)
ALT: 14 U/L (ref 14–54)
ANION GAP: 10 (ref 5–15)
AST: 24 U/L (ref 15–41)
Alkaline Phosphatase: 73 U/L (ref 38–126)
BUN: 22 mg/dL — ABNORMAL HIGH (ref 6–20)
CO2: 26 mmol/L (ref 22–32)
Calcium: 9.3 mg/dL (ref 8.9–10.3)
Chloride: 104 mmol/L (ref 101–111)
Creatinine, Ser: 0.77 mg/dL (ref 0.44–1.00)
GFR calc Af Amer: >60 mL/min (ref >60)
GFR calc non Af Amer: >60 mL/min (ref >60)
GLUCOSE: 90 mg/dL (ref 65–99)
POTASSIUM: 3.9 mmol/L (ref 3.5–5.1)
SODIUM: 140 mmol/L (ref 135–145)
Total Bilirubin: 0.6 mg/dL (ref 0.3–1.2)
Total Protein: 7.1 g/dL (ref 6.5–8.1)

## 2016-07-03 LAB — CBC WITH DIFFERENTIAL/PLATELET
BASOS ABS: 0 10*3/uL (ref 0.0–0.1)
Basophils Relative: 0 %
Eosinophils Absolute: 0 10*3/uL (ref 0.0–0.7)
Eosinophils Relative: 0 %
HCT: 38.8 % (ref 36.0–46.0)
HEMOGLOBIN: 12.5 g/dL (ref 12.0–15.0)
Lymphocytes Relative: 12 %
Lymphs Abs: 0.8 10*3/uL (ref 0.7–4.0)
MCH: 31 pg (ref 26.0–34.0)
MCHC: 32.2 g/dL (ref 30.0–36.0)
MCV: 96.3 fL (ref 78.0–100.0)
MONO ABS: 0.4 10*3/uL (ref 0.1–1.0)
MONOS PCT: 6 %
NEUTROS ABS: 5.2 10*3/uL (ref 1.7–7.7)
NEUTROS PCT: 82 %
Platelets: 192 10*3/uL (ref 150–400)
RBC: 4.03 MIL/uL (ref 3.87–5.11)
RDW: 15.5 % (ref 11.5–15.5)
WBC: 6.4 10*3/uL (ref 4.0–10.5)

## 2016-07-03 MED ORDER — AZITHROMYCIN 250 MG PO TABS: ORAL_TABLET | ORAL | 0 refills | Status: AC

## 2016-07-03 MED ORDER — HYDROGEN PEROXIDE 3 % EX SOLN
CUTANEOUS | Status: AC
  Filled 2016-07-03: qty 473

## 2016-07-03 NOTE — ED Notes (Signed)
Pt wheeled to daughter's car. Pts daughter verbalized understanding of discharge instructions.

## 2016-07-03 NOTE — Discharge Instructions (Signed)
Keep her abrasions clean and dry. Have her rechecked for any problems listed on the head injury sheet. Give her the zpak until gone. Have her rechecked if she gets a high fever or struggles to breathe.

## 2016-07-08 LAB — CULTURE, BLOOD (ROUTINE X 2)
Culture: NO GROWTH
Culture: NO GROWTH

## 2016-12-13 DIAGNOSIS — Z6824 Body mass index (BMI) 24.0-24.9, adult: Secondary | ICD-10-CM | POA: Diagnosis not present

## 2016-12-13 DIAGNOSIS — I10 Essential (primary) hypertension: Secondary | ICD-10-CM | POA: Diagnosis not present

## 2016-12-13 DIAGNOSIS — G309 Alzheimer's disease, unspecified: Secondary | ICD-10-CM | POA: Diagnosis not present

## 2016-12-13 DIAGNOSIS — N393 Stress incontinence (female) (male): Secondary | ICD-10-CM | POA: Diagnosis not present

## 2017-03-31 DIAGNOSIS — R4182 Altered mental status, unspecified: Secondary | ICD-10-CM | POA: Diagnosis not present

## 2017-03-31 DIAGNOSIS — Z6820 Body mass index (BMI) 20.0-20.9, adult: Secondary | ICD-10-CM | POA: Diagnosis not present

## 2017-03-31 DIAGNOSIS — R0902 Hypoxemia: Secondary | ICD-10-CM | POA: Diagnosis not present

## 2017-03-31 DIAGNOSIS — Z79899 Other long term (current) drug therapy: Secondary | ICD-10-CM | POA: Diagnosis not present

## 2017-03-31 DIAGNOSIS — K922 Gastrointestinal hemorrhage, unspecified: Secondary | ICD-10-CM | POA: Diagnosis not present

## 2017-03-31 DIAGNOSIS — N390 Urinary tract infection, site not specified: Secondary | ICD-10-CM | POA: Diagnosis not present

## 2017-03-31 DIAGNOSIS — I959 Hypotension, unspecified: Secondary | ICD-10-CM | POA: Diagnosis not present

## 2017-03-31 DIAGNOSIS — E46 Unspecified protein-calorie malnutrition: Secondary | ICD-10-CM | POA: Diagnosis not present

## 2017-03-31 DIAGNOSIS — E870 Hyperosmolality and hypernatremia: Secondary | ICD-10-CM | POA: Diagnosis not present

## 2017-03-31 DIAGNOSIS — Z886 Allergy status to analgesic agent status: Secondary | ICD-10-CM | POA: Diagnosis not present

## 2017-03-31 DIAGNOSIS — J90 Pleural effusion, not elsewhere classified: Secondary | ICD-10-CM | POA: Diagnosis not present

## 2017-03-31 DIAGNOSIS — Z7982 Long term (current) use of aspirin: Secondary | ICD-10-CM | POA: Diagnosis not present

## 2017-03-31 DIAGNOSIS — A419 Sepsis, unspecified organism: Secondary | ICD-10-CM | POA: Diagnosis not present

## 2017-03-31 DIAGNOSIS — N3001 Acute cystitis with hematuria: Secondary | ICD-10-CM | POA: Diagnosis not present

## 2017-03-31 DIAGNOSIS — F0280 Dementia in other diseases classified elsewhere without behavioral disturbance: Secondary | ICD-10-CM | POA: Diagnosis not present

## 2017-03-31 DIAGNOSIS — D500 Iron deficiency anemia secondary to blood loss (chronic): Secondary | ICD-10-CM | POA: Diagnosis not present

## 2017-03-31 DIAGNOSIS — R195 Other fecal abnormalities: Secondary | ICD-10-CM | POA: Diagnosis not present

## 2017-03-31 DIAGNOSIS — E875 Hyperkalemia: Secondary | ICD-10-CM | POA: Diagnosis not present

## 2017-03-31 DIAGNOSIS — Z87891 Personal history of nicotine dependence: Secondary | ICD-10-CM | POA: Diagnosis not present

## 2017-03-31 DIAGNOSIS — D649 Anemia, unspecified: Secondary | ICD-10-CM | POA: Diagnosis not present

## 2017-03-31 DIAGNOSIS — M810 Age-related osteoporosis without current pathological fracture: Secondary | ICD-10-CM | POA: Diagnosis not present

## 2017-03-31 DIAGNOSIS — J9811 Atelectasis: Secondary | ICD-10-CM | POA: Diagnosis not present

## 2017-03-31 DIAGNOSIS — G92 Toxic encephalopathy: Secondary | ICD-10-CM | POA: Diagnosis not present

## 2017-03-31 DIAGNOSIS — G309 Alzheimer's disease, unspecified: Secondary | ICD-10-CM | POA: Diagnosis not present

## 2017-03-31 DIAGNOSIS — Z66 Do not resuscitate: Secondary | ICD-10-CM | POA: Diagnosis not present

## 2017-04-11 ENCOUNTER — Other Ambulatory Visit: Payer: Self-pay

## 2017-04-11 NOTE — Patient Outreach (Signed)
[facility]) Care Management  04/11/2017  Shelly Hurst 22-Oct-1924 [id number]  Transition of care  Referral date: 04/11/17 Referral source: discharged from [facility] on 04/07/2017 Insurance: [employer] team advantage. Attempt #1  Telephone call to patient regarding transition of care referral. Unable to reach patient.  HIPAA compliant voice message left with call back phone number.   PLAN: RNCM will attempt 2nd telephone call to patient within 3 business days.   Quinn Plowman RN,BSN,CCM [facility] Telephonic  [phone]

## 2017-04-14 ENCOUNTER — Other Ambulatory Visit: Payer: Self-pay

## 2017-04-14 NOTE — Patient Outreach (Signed)
[facility]) Care Management  04/14/2017  Shelly Hurst February 22, 1925 [id number]  Transition of care  Referral date: 04/11/17 Referral source: discharged from [facility] on 03/30/2017 Insurance: Health team advantage. Attempt #2  Telephone call to patient regarding transition of care referral. Unable to reach patient.  HIPAA compliant voice message left with call back phone number.   PLAN: RNCM will attempt 3rd  telephone call to patient within 3 business days.   Quinn Plowman RN,BSN,CCM [facility] Telephonic  [phone]

## 2017-04-15 DEATH — deceased

## 2017-04-16 ENCOUNTER — Other Ambulatory Visit: Payer: PPO

## 2017-04-16 NOTE — Patient Outreach (Signed)
[facility]) Care Management  04/16/2017  Shelly Hurst July 11, 1924 [id number]  Transition of care  Referral date: 04/11/17 Referral source: discharged from [facility] on 04/12/2017 Insurance: Health team advantage  Telephone call to patient for transition of care follow up.  Contact answering phone states patient is deceased.   PLAN:  RNCM will refer to care management assistant to close patient referral RNCM will send notification to patients primary MD of closure   Quinn Plowman RN,BSN,CCM [facility] Telephonic  [phone]

## 2017-07-23 IMAGING — DX DG PELVIS 1-2V
1 series · 1 of 1 positions shown · non-contrast
Comparison: 07/14/2013

CLINICAL DATA: Pelvic pain,

She fell this morning.
EXAM:
PELVIS - 1-2 VIEW

[pelvis ap]
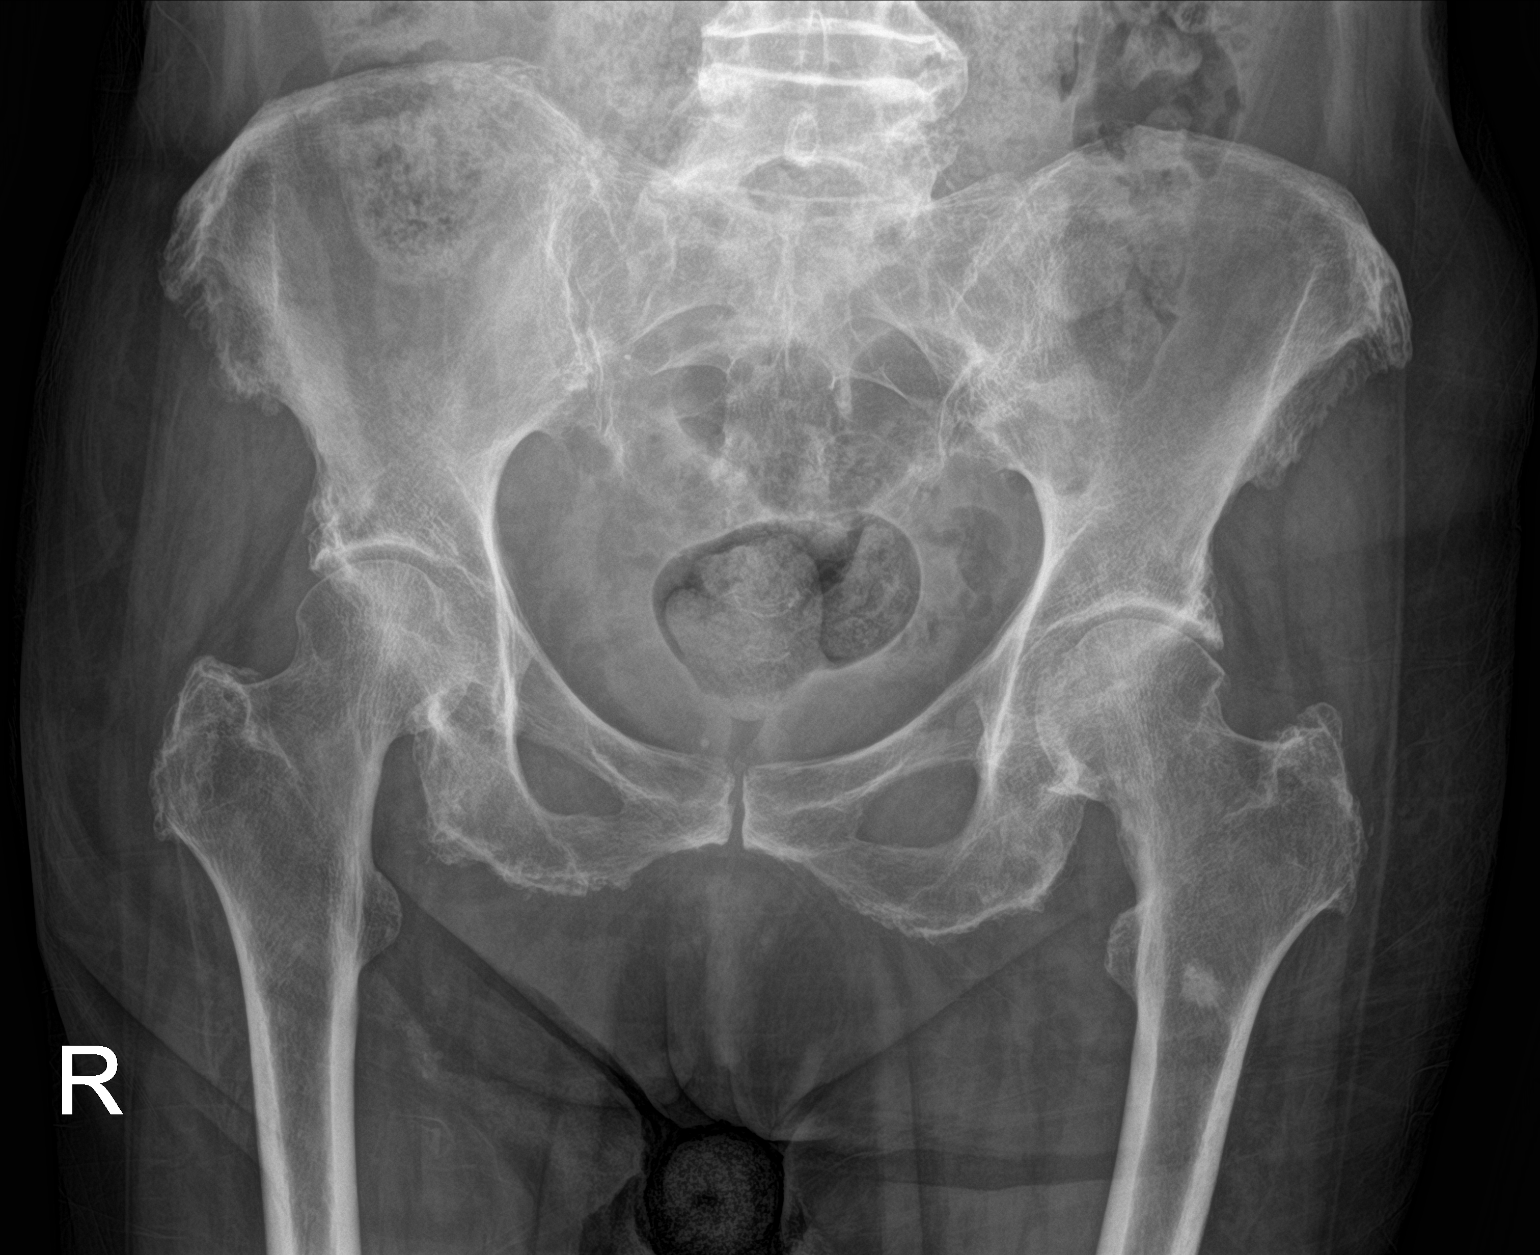

[1 of 1 positions shown; findings below may reference images not displayed]

FINDINGS: Diffuse osteopenia with no fracture. Mild pubic symphysitis. 14 mm
sclerotic lesion proximal left femur stable.
IMPRESSION: No acute findings

## 2017-07-23 IMAGING — CT CT HEAD W/O CM
4 of 5 series · 15 of 47 positions shown, 16 images · non-contrast
Comparison: 11/28/2014

CLINICAL DATA: Fall last night, found by roommate on knees. Denies
head injury or loss of consciousness. History of frequent falls.
Initial encounter.

EXAM:
CT HEAD WITHOUT CONTRAST
CT CERVICAL SPINE WITHOUT CONTRAST
TECHNIQUE: Multidetector CT imaging of the head and cervical spine was
performed following the standard protocol without intravenous
contrast. Multiplanar CT image reconstructions of the cervical spine
were also generated.

[Series 2: headseq 4.8 h37s · axial · 0.47mm/px · z∈[-558,-471]mm · 3 of 36 slices shown, 4 images]
[im 9/36  brain]
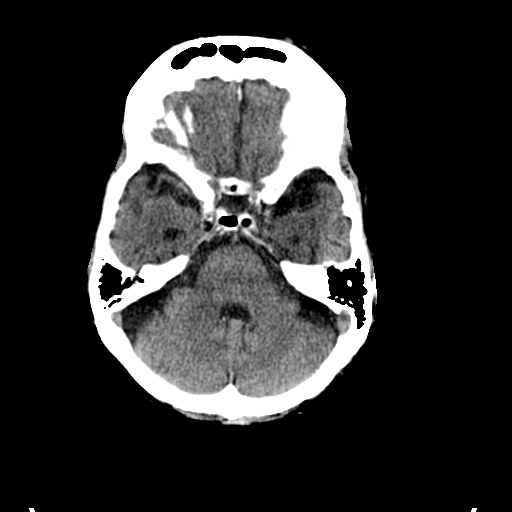
[im 9/36  bone]
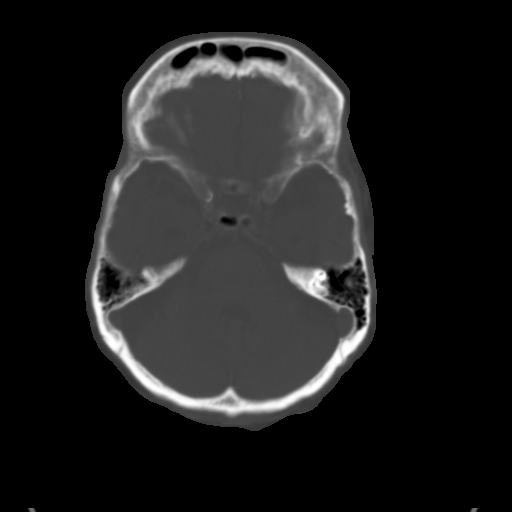
[im 18/36  brain]
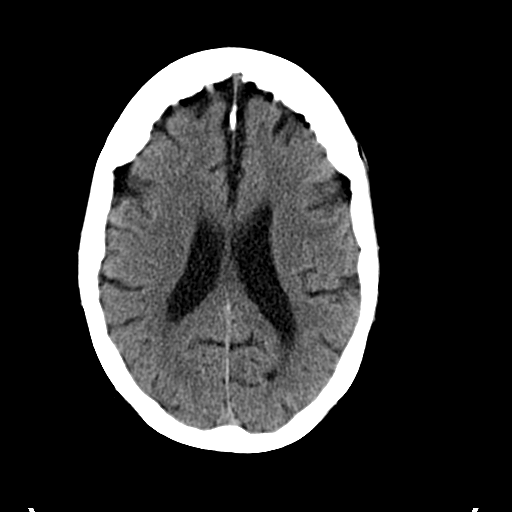
[im 27/36  brain]
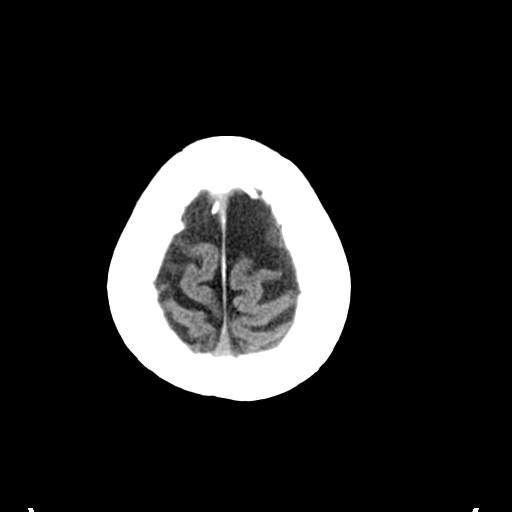

[Series 7: sagittal bone 2.0 · sagittal · 0.25mm/px · 3 of 60 slices shown]
[im 20/60  brain]
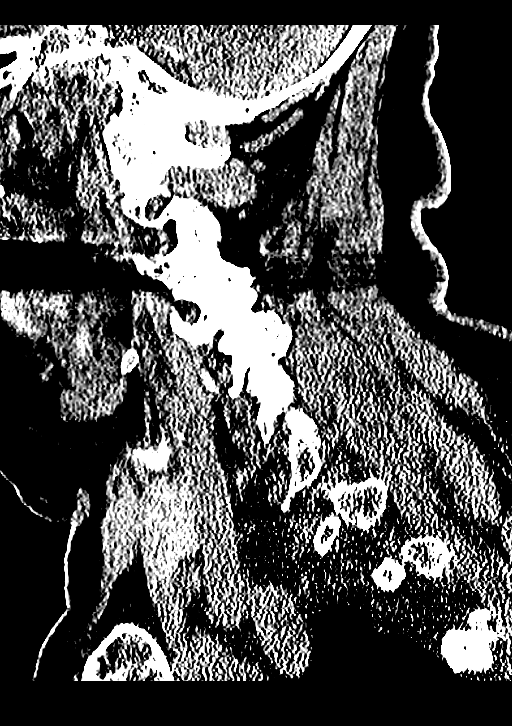
[im 30/60  brain]
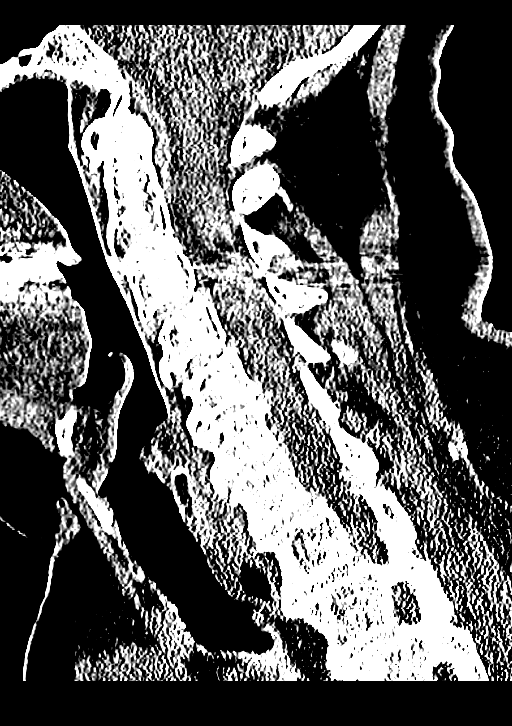
[im 40/60  brain]
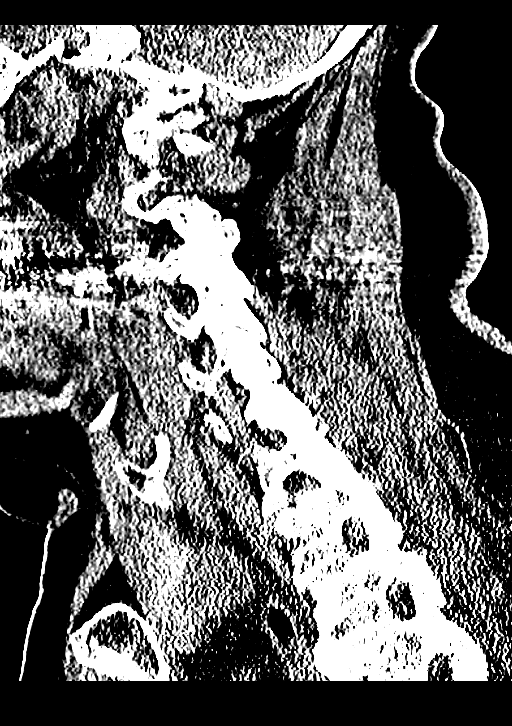

[Series 8: coronal bone 2.0 · coronal · 0.27mm/px · 3 of 52 slices shown]
[im 18/52  brain]
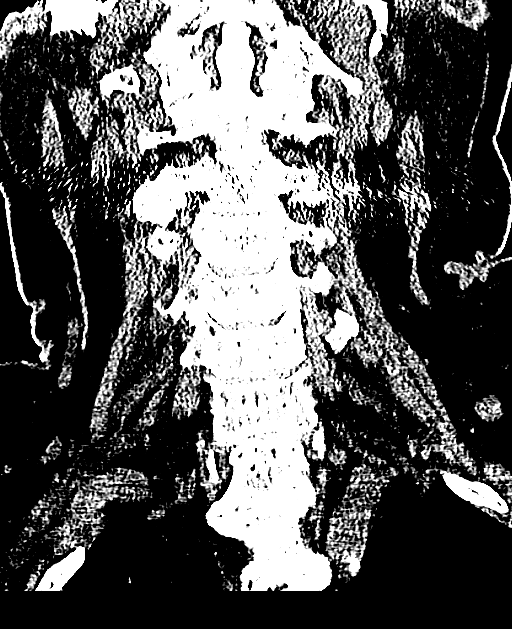
[im 23/52  brain]
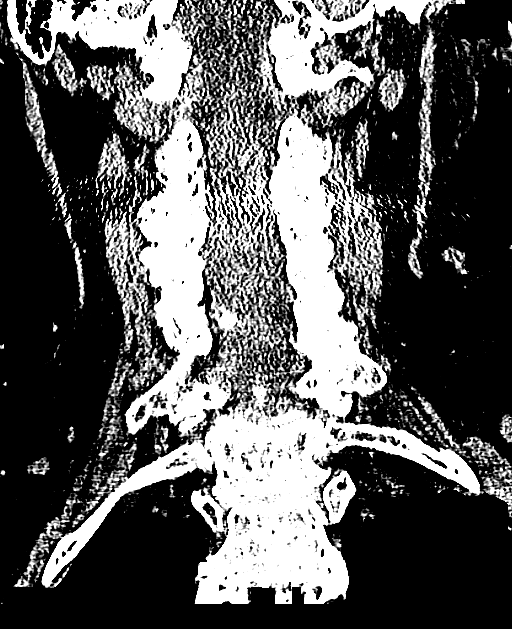
[im 29/52  brain]
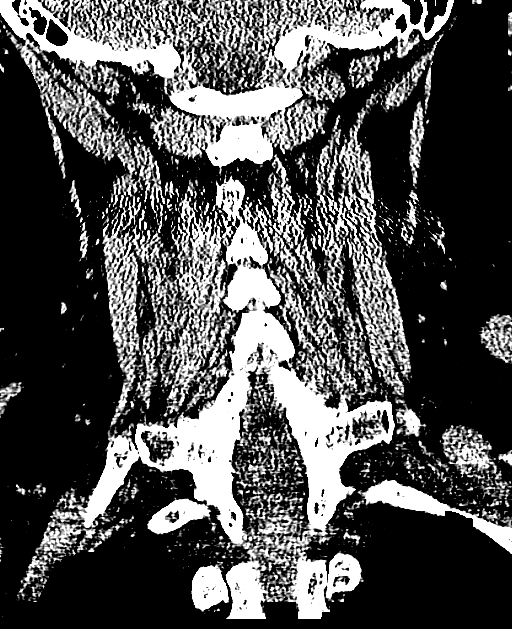

[Series 9: axial bone 2.0 · axial · 0.21mm/px · z∈[-772,-676]mm · 6 of 88 slices shown]
[im 8/88  bone]
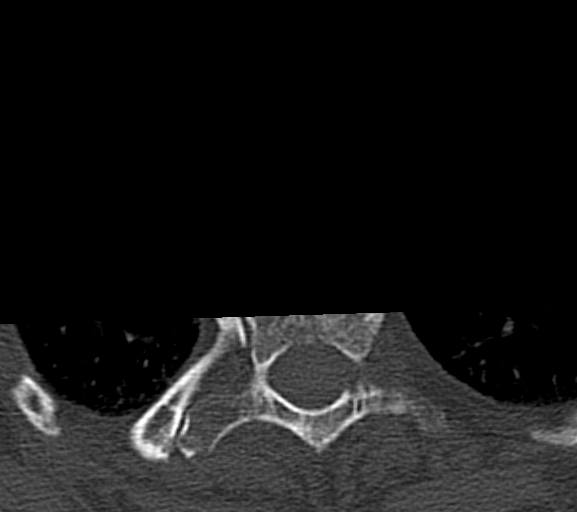
[im 22/88  bone]
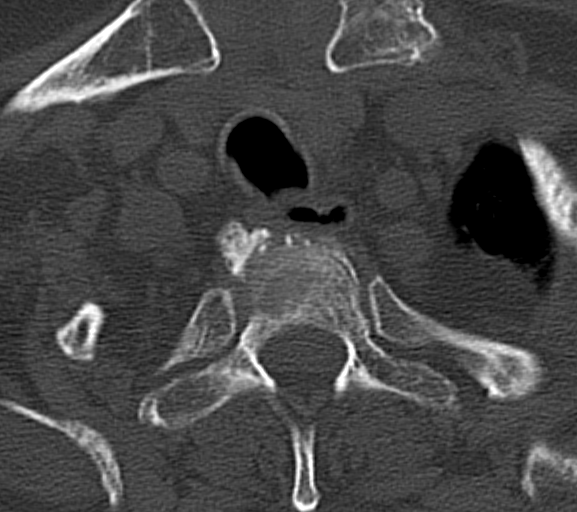
[im 30/88  bone]
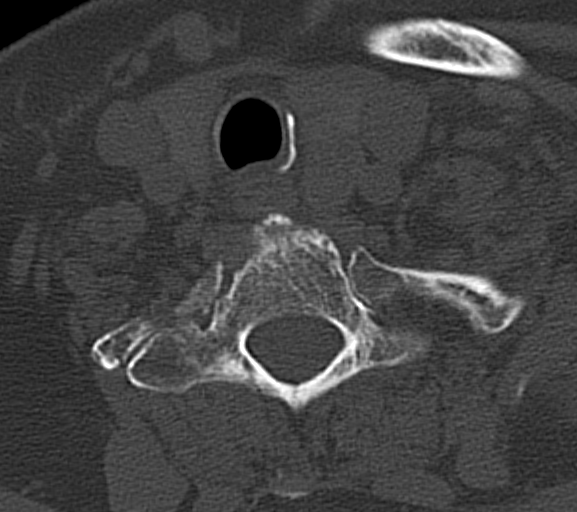
[im 37/88  bone]
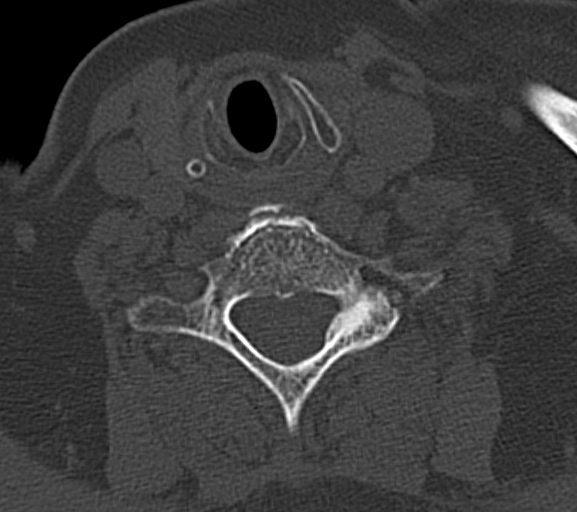
[im 51/88  bone]
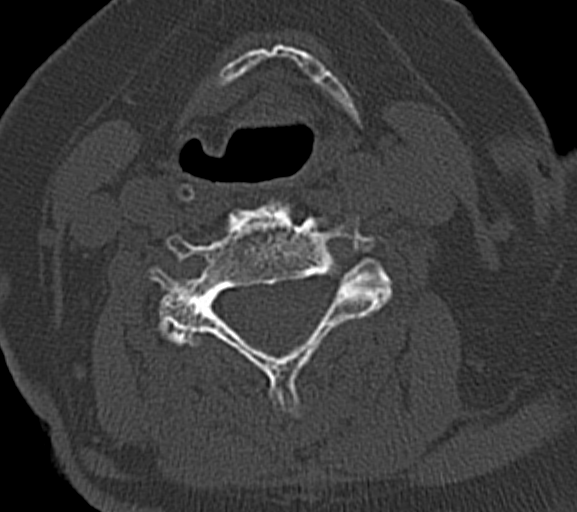
[im 59/88  bone]
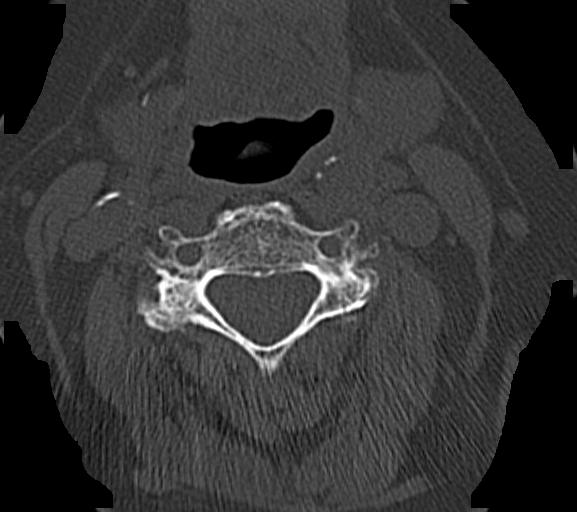

[15 of 47 positions shown; findings below may reference images not displayed]

FINDINGS: CT HEAD FINDINGS

Mild generalized cerebral atrophy is within normal limits for age.
Periventricular white-matter hypodensities are unchanged and
nonspecific but compatible with mild chronic small vessel ischemic
disease. There is no evidence of acute cortical infarct,
intracranial hemorrhage, mass, midline shift, or extra-axial fluid
collection.

Orbits are unremarkable. Visualized paranasal sinuses and mastoid
air cells are clear. No skull fracture is identified.

CT CERVICAL SPINE FINDINGS

Vertebral alignment is unchanged with slight facet mediated
anterolisthesis of C7 on T1. Vertebral body heights are preserved.
No acute cervical spine fracture is identified.

Multilevel disc space narrowing is present, mild-to-moderate at
C5-6. Diffuse endplate osteophytosis is present throughout the mid
and lower cervical spine, and there is moderate to severe multilevel
facet arthrosis with bilateral facet ankylosis at C2-3. 2.1 cm left
thyroid nodule is unchanged. Tiny calcified lymph nodes are again
noted in the left neck. Retropharyngeal course of the proximal left
ICA is noted.
IMPRESSION: 1. No evidence of acute intracranial abnormality.
2. No acute osseous abnormality identified in the cervical spine.
